# Patient Record
Sex: Female | Born: 1955 | Race: White | Hispanic: No | Smoking: Never smoker
Health system: Southern US, Community
[De-identification: ages and names within clinical notes are randomized; demographics above are authoritative.]

## PROBLEM LIST (undated history)

## (undated) DIAGNOSIS — E785 Hyperlipidemia, unspecified: Principal | ICD-10-CM

## (undated) DIAGNOSIS — G4733 Obstructive sleep apnea (adult) (pediatric): Secondary | ICD-10-CM

## (undated) DIAGNOSIS — I10 Essential (primary) hypertension: Secondary | ICD-10-CM

## (undated) DIAGNOSIS — H1859 Other hereditary corneal dystrophies: Secondary | ICD-10-CM

## (undated) DIAGNOSIS — G44321 Chronic post-traumatic headache, intractable: Secondary | ICD-10-CM

## (undated) DIAGNOSIS — F329 Major depressive disorder, single episode, unspecified: Secondary | ICD-10-CM

## (undated) HISTORY — DX: Obstructive sleep apnea (adult) (pediatric): G47.33

## (undated) HISTORY — DX: Major depressive disorder, single episode, unspecified: F32.9

## (undated) HISTORY — DX: Hyperlipidemia, unspecified: E78.5

## (undated) HISTORY — DX: Chronic post-traumatic headache, intractable: G44.321

## (undated) HISTORY — DX: Essential (primary) hypertension: I10

## (undated) HISTORY — DX: Other hereditary corneal dystrophies: H18.59

---

## 1990-08-13 HISTORY — PX: ABDOMINAL HYSTERECTOMY: SHX81

## 2010-08-13 HISTORY — PX: KNEE ARTHROSCOPY: SUR90

## 2016-05-08 ENCOUNTER — Encounter: Payer: Self-pay | Admitting: Pulmonary Disease

## 2016-05-08 ENCOUNTER — Ambulatory Visit (INDEPENDENT_AMBULATORY_CARE_PROVIDER_SITE_OTHER): Payer: BC Managed Care – PPO | Admitting: Pulmonary Disease

## 2016-05-08 VITALS — BP 134/65 | HR 80 | Temp 98.6°F | Ht 67.0 in | Wt 256.3 lb

## 2016-05-08 DIAGNOSIS — E785 Hyperlipidemia, unspecified: Secondary | ICD-10-CM

## 2016-05-08 DIAGNOSIS — Z79899 Other long term (current) drug therapy: Secondary | ICD-10-CM

## 2016-05-08 DIAGNOSIS — Z9989 Dependence on other enabling machines and devices: Secondary | ICD-10-CM

## 2016-05-08 DIAGNOSIS — K5900 Constipation, unspecified: Secondary | ICD-10-CM | POA: Diagnosis not present

## 2016-05-08 DIAGNOSIS — H1852 Epithelial (juvenile) corneal dystrophy: Secondary | ICD-10-CM | POA: Diagnosis not present

## 2016-05-08 DIAGNOSIS — Z23 Encounter for immunization: Secondary | ICD-10-CM

## 2016-05-08 DIAGNOSIS — R7989 Other specified abnormal findings of blood chemistry: Secondary | ICD-10-CM | POA: Insufficient documentation

## 2016-05-08 DIAGNOSIS — H18529 Epithelial (juvenile) corneal dystrophy, unspecified eye: Secondary | ICD-10-CM

## 2016-05-08 DIAGNOSIS — G44321 Chronic post-traumatic headache, intractable: Secondary | ICD-10-CM

## 2016-05-08 DIAGNOSIS — Z8639 Personal history of other endocrine, nutritional and metabolic disease: Secondary | ICD-10-CM

## 2016-05-08 DIAGNOSIS — K5901 Slow transit constipation: Secondary | ICD-10-CM

## 2016-05-08 DIAGNOSIS — H1859 Other hereditary corneal dystrophies: Secondary | ICD-10-CM

## 2016-05-08 DIAGNOSIS — F339 Major depressive disorder, recurrent, unspecified: Secondary | ICD-10-CM | POA: Insufficient documentation

## 2016-05-08 DIAGNOSIS — G4733 Obstructive sleep apnea (adult) (pediatric): Secondary | ICD-10-CM | POA: Insufficient documentation

## 2016-05-08 DIAGNOSIS — F32A Depression, unspecified: Secondary | ICD-10-CM

## 2016-05-08 HISTORY — DX: Hyperlipidemia, unspecified: E78.5

## 2016-05-08 HISTORY — DX: Chronic post-traumatic headache, intractable: G44.321

## 2016-05-08 HISTORY — DX: Depression, unspecified: F32.A

## 2016-05-08 HISTORY — DX: Epithelial (juvenile) corneal dystrophy, unspecified eye: H18.529

## 2016-05-08 HISTORY — DX: Obstructive sleep apnea (adult) (pediatric): G47.33

## 2016-05-08 NOTE — Patient Instructions (Addendum)
Is there anything I can do on my own to get rid of constipation? - Yes. Try these steps: ?Eat foods that have a lot of fiber. Good choices are fruits, vegetables, prune juice, and cereal. You may try adding fiber with products such as Metamucil or Benefiber. ?Drink plenty of water and other fluids. ?When you feel the need to go to the bathroom, go to the bathroom. Don't hold it. ?Take stool softeners. These are medicines that help make bowel movements easier to get out. Some are pills that you swallow. Others go into the rectum. These are called "suppositories." You may use medications such as Miralax, docusate, Dulcolax, etc.  Should I see a doctor or nurse? - See your doctor or nurse if:  ?Your symptoms are new or not normal for you ?You do not have a bowel movement for a few days ?The problem comes and goes, but lasts for longer than 3 weeks ?You are in a lot of pain ?You have other symptoms that also worry you (for example, bleeding, weakness, weight loss, or fever) ?Other people in your family have had colorectal cancer or inflammatory bowel disease  Follow up in 3 months.

## 2016-05-08 NOTE — Progress Notes (Signed)
   CC: Constipation  HPI:  Heidi Mcguire is a 60 year old woman here to establish care at [facility] and evaluate for constipation.  She has four children. She reports she has never had a hemorrhoid before. Felt like a "tumor" in her rectal area. Gone now (went away a couple of weeks ago) on visual inspection. It was very painful. At one point she got very constipated. Resolved after starting stool softener. No blood in stools. Presently, she has bowel movements every other day. Formed and soft at last bowel movement.   In a normal day, she has oatmeal with apple for breakfast. May have granola instead. Peanut butter sandwich for lunch. Drinks water throughout day. Eats fast food for dinner - chicken sandwich and french fries at [employer]. Eats cookies.    Past Medical History:  Diagnosis Date  . 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 Depression 05/08/2016  . Hyperlipidemia 05/08/2016  . Intractable chronic post-traumatic headache 05/08/2016   October 29, 2014 fell off a chair onto concrete floor  . Obstructive sleep apnea 05/08/2016   Past Surgical History:  Procedure Laterality Date  . ABDOMINAL HYSTERECTOMY  1992  . KNEE ARTHROSCOPY  2012   Allergies  Allergen Reactions  . Bupropion     Headaches  . Esomeprazole Magnesium Diarrhea  . Gemfibrozil Hives  . Lansoprazole Diarrhea  . Omeprazole Magnesium Diarrhea  . Simvastatin Hives   Review of Systems:   Constitutional: no fevers/chills Eyes: no vision changes Ears, nose, mouth, throat, and face: no cough Respiratory: no shortness of breath Cardiovascular: no chest pain Gastrointestinal: no nausea/vomiting, no abdominal pain, no diarrhea Genitourinary: no dysuria, no hematuria Integument: no rash Hematologic/lymphatic: no bleeding/bruising, no edema Musculoskeletal: no arthralgias, no myalgias Neurological: no paresthesias, no weakness  Physical Exam:  Vitals:   05/08/16 1532  BP: 134/65  Pulse: 80    Temp: 98.6 F (37 C)  TempSrc: Oral  SpO2: 98%  Weight: 116.3 kg (256 lb 4.8 oz)  Height: 5\' 7"  (1.702 m)   General Apperance: NAD HEENT: Normocephalic, atraumatic, anicteric sclera Neck: Supple, trachea midline Lungs: Clear to auscultation bilaterally. No wheezes, rhonchi or rales. Breathing comfortably Heart: Regular rate and rhythm Abdomen: Soft, nontender, nondistended, no rebound/guarding Extremities: Warm and well perfused, no edema Skin: No rashes or lesions Neurologic: Alert and interactive. No gross deficits.  Assessment & Plan:   See Encounters Tab for problem based charting.  Patient discussed with Dr. Criselda PeachesMullen

## 2016-05-09 LAB — LIPID PANEL
CHOL/HDL RATIO: 6.3 ratio — AB (ref 0.0–4.4)
CHOLESTEROL TOTAL: 225 mg/dL — AB (ref 100–199)
HDL: 36 mg/dL — AB (ref >39)
LDL CALC: 115 mg/dL — AB (ref 0–99)
TRIGLYCERIDES: 370 mg/dL — AB (ref 0–149)
VLDL Cholesterol Cal: 74 mg/dL — ABNORMAL HIGH (ref 5–40)

## 2016-05-09 LAB — BMP8+ANION GAP
ANION GAP: 17 mmol/L (ref 10.0–18.0)
BUN/Creatinine Ratio: 15 (ref 9–23)
BUN: 13 mg/dL (ref 6–24)
CALCIUM: 9.4 mg/dL (ref 8.7–10.2)
CO2: 24 mmol/L (ref 18–29)
CREATININE: 0.88 mg/dL (ref 0.57–1.00)
Chloride: 100 mmol/L (ref 96–106)
GFR calc Af Amer: 83 mL/min/{1.73_m2} (ref >59)
GFR calc non Af Amer: 72 mL/min/{1.73_m2} (ref >59)
Glucose: 94 mg/dL (ref 65–99)
Potassium: 4.3 mmol/L (ref 3.5–5.2)
SODIUM: 141 mmol/L (ref 134–144)

## 2016-05-09 LAB — TSH: TSH: 3.94 u[IU]/mL (ref 0.450–4.500)

## 2016-05-11 ENCOUNTER — Encounter: Payer: Self-pay | Admitting: Pulmonary Disease

## 2016-05-11 DIAGNOSIS — K5900 Constipation, unspecified: Secondary | ICD-10-CM | POA: Insufficient documentation

## 2016-05-11 NOTE — Assessment & Plan Note (Signed)
Assessment: Related to falling off chair onto concrete floor in March 2016. Followed by neurology previously. Would like to establish with local neurologist.  Plan: Referral placed Continue current pain regimen

## 2016-05-11 NOTE — Assessment & Plan Note (Signed)
Assessment: She would like referral to opthalmology. Previously was using Restasis but cannot afford it due to not being covered by insurance. She previously declined plugs.  Plan: Referral to opthalmology

## 2016-05-11 NOTE — Assessment & Plan Note (Addendum)
Lipid Panel     Component Value Date/Time   CHOL 225 (H) 05/08/2016 1633   TRIG 370 (H) 05/08/2016 1633   HDL 36 (L) 05/08/2016 1633   CHOLHDL 6.3 (H) 05/08/2016 1633   LDLCALC 115 (H) 05/08/2016 1633   4.9% 10-year risk of heart disease or stroke.  Plan: Continue fish oil and lifestyle modification. Letter sent to patient.

## 2016-05-11 NOTE — Assessment & Plan Note (Addendum)
Previously noted to be high in [place]. Resolved now.

## 2016-05-11 NOTE — Assessment & Plan Note (Signed)
Assessment: Uses a CPAP. She would like to establish care with sleep medicine. No complaints.  Plan: Referral placed.

## 2016-05-11 NOTE — Assessment & Plan Note (Signed)
Assessment: External hemorrhoid resolved per patient. Unlikely to be tumor given history. She denies weight loss or family history of colon cancer.  Plan: Bowel regimen recommended including maintaining hydration, increasing fiber in diet, using stool softeners as needed.

## 2016-05-21 NOTE — Progress Notes (Addendum)
Internal Medicine Clinic Attending  Case discussed with Dr. Isabella BowensKrall soon after the resident saw the patient.  We reviewed the resident's history and exam and pertinent patient test results.  I agree with the assessment, diagnosis, and plan of care documented in the resident's note.  Documented in chart, but not in below note - patient reported being a never smoker.

## 2016-06-03 ENCOUNTER — Encounter: Payer: Self-pay | Admitting: Pulmonary Disease

## 2016-06-05 NOTE — Addendum Note (Signed)
Addended by: Dorie RankPOWERS, Kaytlynn Kochan E on: 06/05/2016 10:04 AM   Modules accepted: Orders

## 2016-06-13 ENCOUNTER — Encounter: Payer: Self-pay | Admitting: Pulmonary Disease

## 2016-06-13 ENCOUNTER — Other Ambulatory Visit: Payer: Self-pay | Admitting: *Deleted

## 2016-06-14 ENCOUNTER — Other Ambulatory Visit: Payer: Self-pay | Admitting: *Deleted

## 2016-06-18 ENCOUNTER — Other Ambulatory Visit: Payer: Self-pay | Admitting: Pulmonary Disease

## 2016-06-18 MED ORDER — ESCITALOPRAM OXALATE 20 MG PO TABS: 20.0000 mg | ORAL_TABLET | Freq: Every day | ORAL | 1 refills | Status: DC

## 2016-07-01 ENCOUNTER — Encounter: Payer: Self-pay | Admitting: Pulmonary Disease

## 2016-07-13 ENCOUNTER — Telehealth: Payer: Self-pay | Admitting: Pharmacist

## 2016-07-13 DIAGNOSIS — N951 Menopausal and female climacteric states: Secondary | ICD-10-CM

## 2016-07-13 MED ORDER — ESTRADIOL 10 MCG VA TABS: 1.0000 | ORAL_TABLET | VAGINAL | 3 refills | Status: DC

## 2016-07-13 MED ORDER — ESTRADIOL 2 MG VA RING: 2.0000 mg | VAGINAL_RING | VAGINAL | 12 refills | Status: DC

## 2016-07-13 MED ORDER — ESTRADIOL 0.1 MG/GM VA CREA: 1.0000 | TOPICAL_CREAM | Freq: Every day | VAGINAL | 12 refills | Status: DC

## 2016-07-13 NOTE — Progress Notes (Signed)
Patient called requesting help with cost of estradiol vaginal tablet. Tried various vaginal formulations and all are more than $150 copay. Advised patient to follow up with PCP and contact clinic if any concerns arise. Will continue to collaborate in the care of this patient.

## 2016-07-19 ENCOUNTER — Encounter: Payer: Self-pay | Admitting: Pulmonary Disease

## 2016-07-19 ENCOUNTER — Ambulatory Visit (INDEPENDENT_AMBULATORY_CARE_PROVIDER_SITE_OTHER): Payer: BC Managed Care – PPO | Admitting: Pulmonary Disease

## 2016-07-19 VITALS — BP 148/82 | HR 57 | Ht 67.0 in | Wt 254.8 lb

## 2016-07-19 DIAGNOSIS — G4733 Obstructive sleep apnea (adult) (pediatric): Secondary | ICD-10-CM

## 2016-07-19 NOTE — Progress Notes (Signed)
Past Surgical History She  has a past surgical history that includes Abdominal hysterectomy (1992) and Knee arthroscopy (2012).  Allergies  Allergen Reactions  . Bupropion     Headaches  . Esomeprazole Magnesium Diarrhea  . Gemfibrozil Hives  . Lansoprazole Diarrhea    diarrhea  . Omeprazole Magnesium Diarrhea  . Simvastatin Hives    hives    Family History Her family history includes Alzheimer's disease in her father; Bipolar disorder in her sister; Depression in her mother and sister.  Social History She  reports that she has never smoked. She has never used smokeless tobacco. She reports that she drinks alcohol. She reports that she does not use drugs.  Review of systems Constitutional: Negative for fever and unexpected weight change.  HENT: Negative for congestion, dental problem, ear pain, nosebleeds, postnasal drip, rhinorrhea, sinus pressure, sneezing, sore throat and trouble swallowing.   Eyes: Negative for redness and itching.  Respiratory: Negative for cough, chest tightness, shortness of breath and wheezing.   Cardiovascular: Negative for palpitations and leg swelling.  Gastrointestinal: Negative for nausea and vomiting.  Genitourinary: Negative for dysuria.  Musculoskeletal: Negative for joint swelling.  Skin: Negative for rash.  Neurological: Positive for headaches.  Hematological: Does not bruise/bleed easily.  Psychiatric/Behavioral: Negative for dysphoric mood. The patient is not nervous/anxious.     Current Outpatient Prescriptions on File Prior to Visit  Medication Sig  . Calcium Carbonate-Vitamin D (CALCIUM 500 + D) 500-125 MG-UNIT TABS Take 2 tablets by mouth daily.  Marland Kitchen. escitalopram (LEXAPRO) 20 MG tablet Take 1 tablet (20 mg total) by mouth daily.  Marland Kitchen. ibuprofen (ADVIL,MOTRIN) 800 MG tablet Take 1 tablet by mouth every 8 (eight) hours as needed.  . Multiple Vitamin (MULTIVITAMIN) capsule Take 1 capsule by mouth daily.  . Nutritional Supplements (ADULT  NUTRITIONAL SUPPLEMENT PO) Take 1 tablet by mouth 2 (two) times daily.  . ranitidine (ZANTAC) 150 MG tablet Take 1 tablet by mouth 2 (two) times daily.  . traMADol (ULTRAM) 50 MG tablet Take 1 tablet by mouth every 8 (eight) hours as needed.   No current facility-administered medications on file prior to visit.     Chief Complaint  Patient presents with  . SLEEP CONSULT    Referred by Dr Janeece RiggersKraull. Pt currently uses CPAP machine, pressure 8. Needs new DME. Epworth Score: 2    Sleep tests PSG 09/01/13 >> AI 54 CPAP 04/20/16 to 07/18/16 >> used on 90 of 90 nights with average 9 hrs 19 min.  Average AHI 1.5 with CPAP 8 cm H2O  Past medical history She  has a past medical history of Corneal epithelial and basement membrane dystrophy (05/08/2016); Depression (05/08/2016); Hyperlipidemia (05/08/2016); Intractable chronic post-traumatic headache (05/08/2016); and Obstructive sleep apnea (05/08/2016).  Vital signs BP (!) 148/82 (BP Location: Left Arm, Cuff Size: Normal)   Pulse (!) 57   Ht 5\' 7"  (1.702 m)   Wt 254 lb 12.8 oz (115.6 kg)   SpO2 97%   BMI 39.91 kg/m   History of Present Illness Heidi Mcguire is a 60 y.o. female for evaluation of sleep problems.  She had [facility] January [facility] while living in [place].  She was found to have severe sleep apnea, and started on CPAP 8 cm H2O.  She recently retired, and [place].  She continues to use CPAP, and needs to establish with a sleep medicine provider [place] .  She goes to sleep between 8 and  10 pm.  She falls asleep after 30 minutes.  She wakes up one time to use the bathroom.  She gets out of bed at 630 am.  She feels rested in the morning.  She does not use anything to help her fall sleep or stay awake.  She fell at work in 2006 and hit her head.  She suffered a concussion, and since then gets migraine headaches.  She has noticed getting funny feelings in her feet while she is  trying to fall asleep.  She wiggles her feet for several minutes, and then she is able to fall asleep.  She uses nasal pillows mask.  No issues with mask fit.  She tried full face mask before, but finds her current mask more comfortable.  She denies sleep walking, sleep talking, bruxism, or nightmares.  There is no history of restless legs.  She denies sleep hallucinations, sleep paralysis, or cataplexy.  The Epworth score is 2 out of 24.   Physical Exam:  General - No distress ENT - No sinus tenderness, no oral exudate, no LAN, no thyromegaly, TM clear, pupils equal/reactive, MP 3, enlarged tongue Cardiac - s1s2 regular, no murmur, pulses symmetric Chest - No wheeze/rales/dullness, good air entry, normal respiratory excursion Back - No focal tenderness Abd - Soft, non-tender, no organomegaly, + bowel sounds Ext - No edema Neuro - Normal strength, cranial nerves intact Skin - No rashes Psych - Normal mood, and behavior  Discussion: She has history of severe sleep apnea and has been maintained on CPAP 8 cm H2O.  She is compliant with therapy, and needs to establish with a sleep medicine provider in [place].  She also has symptoms suggestive of restless leg syndrome.  She feels her current symptoms are mild.  Assessment/plan:  Obstructive sleep apnea. - continue CPAP 8 cm H2O - will arrange for new DME for CPAP supplies - will get [facility]  Restless leg syndrome. - mild - monitor clinically - if progresses, then consider lab analysis including ferritin level   Patient Instructions  Will have you sign release form to get copy of sleep study [facility]  Will arrange for new home care company for CPAP supplies  Follow up in 1 year    Coralyn HellingVineet Cloud Graham, M.D. Pager [phone] 07/19/2016, 10:09 AM

## 2016-07-19 NOTE — Progress Notes (Signed)
   Subjective:    Patient ID: Heidi Mcguire, female    DOB: 1955-10-10, 60 y.o.   MRN: [id number]  HPI    Review of Systems  Constitutional: Negative for fever and unexpected weight change.  HENT: Negative for congestion, dental problem, ear pain, nosebleeds, postnasal drip, rhinorrhea, sinus pressure, sneezing, sore throat and trouble swallowing.   Eyes: Negative for redness and itching.  Respiratory: Negative for cough, chest tightness, shortness of breath and wheezing.   Cardiovascular: Negative for palpitations and leg swelling.  Gastrointestinal: Negative for nausea and vomiting.  Genitourinary: Negative for dysuria.  Musculoskeletal: Negative for joint swelling.  Skin: Negative for rash.  Neurological: Positive for headaches.  Hematological: Does not bruise/bleed easily.  Psychiatric/Behavioral: Negative for dysphoric mood. The patient is not nervous/anxious.        Objective:   Physical Exam        Assessment & Plan:

## 2016-07-19 NOTE — Patient Instructions (Signed)
Will have you sign release form to get copy of sleep study from [facility]  Will arrange for new home care company for CPAP supplies  Follow up in 1 year

## 2016-08-01 ENCOUNTER — Telehealth: Payer: Self-pay | Admitting: Pulmonary Disease

## 2016-08-01 NOTE — Telephone Encounter (Signed)
APT. REMINDER CALL, LMTCB °

## 2016-08-02 ENCOUNTER — Ambulatory Visit (INDEPENDENT_AMBULATORY_CARE_PROVIDER_SITE_OTHER): Payer: BC Managed Care – PPO | Admitting: Pulmonary Disease

## 2016-08-02 ENCOUNTER — Encounter: Payer: Self-pay | Admitting: Pulmonary Disease

## 2016-08-02 VITALS — BP 139/68 | HR 84 | Temp 98.7°F | Ht 67.0 in | Wt 258.1 lb

## 2016-08-02 DIAGNOSIS — G44321 Chronic post-traumatic headache, intractable: Secondary | ICD-10-CM | POA: Diagnosis not present

## 2016-08-02 DIAGNOSIS — N952 Postmenopausal atrophic vaginitis: Secondary | ICD-10-CM

## 2016-08-02 DIAGNOSIS — Z6841 Body Mass Index (BMI) 40.0 and over, adult: Secondary | ICD-10-CM | POA: Diagnosis not present

## 2016-08-02 MED ORDER — SUMATRIPTAN SUCCINATE 50 MG PO TABS: 50.0000 mg | ORAL_TABLET | ORAL | 0 refills | Status: DC | PRN

## 2016-08-02 NOTE — Progress Notes (Signed)
   CC: Headache  HPI:  [name] is a 60 y.o. woman with HLD, chronic post traumatic headache, OSA presenting for follow up of her headache  Her headache has gotten a lot worse. She has sensitive to light, sound. She has nausea. She takes tramadol, ibuprofen, Migravent. She just has chronic vision changes and no acute. Headaches are more in the morning but she does not wake up with headaches.  She has had these headaches since 2016 after falling at work with head injury.  She has tried Gabapentin and Topamax. Her appointment is January 22nd with neurology.   Past Medical History:  Diagnosis Date  . 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 Depression 05/08/2016  . Hyperlipidemia 05/08/2016  . Intractable chronic post-traumatic headache 05/08/2016   October 29, 2014 fell off a chair onto concrete floor  . Obstructive sleep apnea 05/08/2016    Review of Systems:   No chest pain No dyspnea  Physical Exam:  Vitals:   08/02/16 1603  BP: 139/68  Pulse: 84  Temp: 98.7 F (37.1 C)  TempSrc: Oral  SpO2: 98%  Weight: 258 lb 1.6 oz (117.1 kg)  Height: 5\' 7"  (1.702 m)   General Apperance: NAD HEENT: Normocephalic, atraumatic, anicteric sclera Neck: Supple, trachea midline Lungs: Clear to auscultation bilaterally. No wheezes, rhonchi or rales. Breathing comfortably Heart: Regular rate and rhythm, no murmur/rub/gallop Abdomen: Soft, nontender, nondistended, no rebound/guarding Extremities: Warm and well perfused, no edema Skin: No rashes or lesions Neurologic: Alert and interactive. No gross deficits.  Assessment & Plan:   See Encounters Tab for problem based charting.  Patient discussed with Dr. Criselda PeachesMullen

## 2016-08-02 NOTE — Patient Instructions (Signed)
You may take sumatriptan (Imitrex) single dose of 50 mg (taken with fluids) for your ehadache. If a satisfactory response has not been obtained at 2 hours, a second dose may be administered. The total daily dose should not exceed 200 mg.  Follow up in 2-3 months

## 2016-08-03 DIAGNOSIS — N952 Postmenopausal atrophic vaginitis: Secondary | ICD-10-CM | POA: Insufficient documentation

## 2016-08-03 NOTE — Assessment & Plan Note (Signed)
Assessment: Post traumatic headache that is chronic. She does not think she has tried a triptan in the past.   Plan: Sumatriptan 50mg  every 2 hours as needed. If she does not find relief with this may consider trying antiemetic. Encouraged her to keep her appointment with neurology

## 2016-08-03 NOTE — Assessment & Plan Note (Signed)
She was previously followed by gynecology in [place]. She is going to try vaginal moisterizers OTC. She would like to establish care with a gynecologist here. Referral placed

## 2016-08-03 NOTE — Assessment & Plan Note (Signed)
She is interested in dietary changes to lose weight. Referred her to medical nutrition therapy.

## 2016-08-08 ENCOUNTER — Telehealth: Payer: Self-pay | Admitting: *Deleted

## 2016-08-08 NOTE — Progress Notes (Signed)
Internal Medicine Clinic Attending  Case discussed with Dr. Krall soon after the resident saw the patient.  We reviewed the resident's history and exam and pertinent patient test results.  I agree with the assessment, diagnosis, and plan of care documented in the resident's note. 

## 2016-08-16 ENCOUNTER — Ambulatory Visit: Payer: BC Managed Care – PPO | Admitting: Dietician

## 2016-08-22 ENCOUNTER — Telehealth: Payer: Self-pay | Admitting: Pulmonary Disease

## 2016-08-22 NOTE — Telephone Encounter (Signed)
APT. REMINDER CALL, LMTCB °

## 2016-08-23 ENCOUNTER — Ambulatory Visit (INDEPENDENT_AMBULATORY_CARE_PROVIDER_SITE_OTHER): Payer: BC Managed Care – PPO | Admitting: Dietician

## 2016-08-23 DIAGNOSIS — Z713 Dietary counseling and surveillance: Secondary | ICD-10-CM | POA: Diagnosis not present

## 2016-08-23 DIAGNOSIS — Z6841 Body Mass Index (BMI) 40.0 and over, adult: Secondary | ICD-10-CM | POA: Diagnosis not present

## 2016-08-23 NOTE — Progress Notes (Signed)
  Medical Nutrition Therapy:  Appt start time: 1500 end time:  1545. Visit # 1  Assessment:  Primary concerns today: assistance with meal planning and problem solving.  [name] wanted help today with figuring out a plan to help her eat healthier and decrease her weight.she reports having kept food records in the past.  She is currently stressed due to being [place] full [place], about to move, has daily migraines with some nausea. She is thinking about starting to grow her own fruits and vegetables after she moves to her new home.  She says she comes home after work like a "zombie" and eats large amounts of food that are often fast food or convenience foods. We discussed breaking behavior chains, preparing foods ahead of time on weekends and recipes that she likes and are healthy.  Preferred Learning Style: No preference indicated  Learning Readiness: Contemplating/Ready  ANTHROPOMETRICS: weight-258#, height-67", BMI-40.5 WEIGHT HISTORY: need to assess at future visit SLEEP:uses a CPAP, some times has trouble falling asleep, sleeps less than her desired time during the week and 10-12 hours on weekends Food Intolerances: none reported Nausea: sometimes with migraines Dining Out (times/week): has been eating fast food more lately because of work and moving stress MEDICATIONS: ranitidine, calcium with Vitamin D Labs:she says she has prediabetes, but we do not have an A1C, note LDL of 115 HDL of 36 and triglycerides of 370  DIETARY INTAKE: Usual eating pattern not discussed today except that she eats at least 3 times a day with her migraine medicine, and during the work week comes home and eats large amounts, much fast food and snack foods like chips. 24 hours recall not done today    Usual physical activity: limited to ADLs- works 5 days a week and is gong to move this weekend  Estimated daily energy needs: 2100-2200 calories to maintain, 1600-1800 for  gradual loss 50- 100 g protein depends on weight goals, 50 for maintenance, 75-100 for weight loss   Progress Towards Goal(s):  In progress.   Nutritional Diagnosis:  NB-1.1 Food and nutrition-related knowledge deficit As related to lack of sufficient prior meal plan training and education.  As evidenced by her report and questions.    Intervention:  Nutrition education about the process of breaking habits, making behavior changes and how to achieve weight loss. Patient prefers to work on changes by herself and to call as needed.  Coordination of care: none needed today although it is appropriate to offer weight loss medication or bariatric surgery with BMI of 40 and co morbidities of hyperlipidemia,  OSA, ,Depression   Teaching Method Utilized: Visual.Auditory,Hands on Handouts given during visit include:soup and stew recipes, weight loss information and lifestyle  Barriers to learning/adherence to lifestyle change: competing values, depression, questions her support system Demonstrated degree of understanding via:  Teach Back   Monitoring/Evaluation:  Dietary intake, exercise, and body weight prn per patient preference. Norrin Shreffler, Lupita LeashDonna, RD 08/23/2016 4:20 PM.

## 2016-09-05 ENCOUNTER — Ambulatory Visit (INDEPENDENT_AMBULATORY_CARE_PROVIDER_SITE_OTHER): Payer: BC Managed Care – PPO | Admitting: Internal Medicine

## 2016-09-05 ENCOUNTER — Ambulatory Visit (HOSPITAL_COMMUNITY)
Admission: RE | Admit: 2016-09-05 | Discharge: 2016-09-05 | Disposition: A | Discharge status: Home / Self Care | Payer: BC Managed Care – PPO | Source: Ambulatory Visit | Attending: Internal Medicine | Admitting: Internal Medicine

## 2016-09-05 DIAGNOSIS — M5010 Cervical disc disorder with radiculopathy, unspecified cervical region: Secondary | ICD-10-CM | POA: Diagnosis present

## 2016-09-05 DIAGNOSIS — Z818 Family history of other mental and behavioral disorders: Secondary | ICD-10-CM

## 2016-09-05 DIAGNOSIS — M4802 Spinal stenosis, cervical region: Secondary | ICD-10-CM | POA: Insufficient documentation

## 2016-09-05 DIAGNOSIS — M546 Pain in thoracic spine: Secondary | ICD-10-CM

## 2016-09-05 DIAGNOSIS — G43909 Migraine, unspecified, not intractable, without status migrainosus: Secondary | ICD-10-CM | POA: Diagnosis not present

## 2016-09-05 DIAGNOSIS — M79602 Pain in left arm: Secondary | ICD-10-CM

## 2016-09-05 DIAGNOSIS — Z820 Family history of epilepsy and other diseases of the nervous system: Secondary | ICD-10-CM

## 2016-09-05 DIAGNOSIS — M542 Cervicalgia: Secondary | ICD-10-CM | POA: Diagnosis not present

## 2016-09-05 DIAGNOSIS — Z79899 Other long term (current) drug therapy: Secondary | ICD-10-CM

## 2016-09-05 MED ORDER — GABAPENTIN 300 MG PO CAPS: 300.0000 mg | ORAL_CAPSULE | Freq: Three times a day (TID) | ORAL | 2 refills | Status: DC

## 2016-09-05 NOTE — Progress Notes (Signed)
CC: shoulder pain HPI: [name] is a 61 y.o. female with a h/o of migraines who presents for shoulder pain.  Please see Problem-based charting for HPI and the status of patient's chronic medical conditions.  Past Medical History:  Diagnosis Date  . 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 Depression 05/08/2016  . Hyperlipidemia 05/08/2016  . Intractable chronic post-traumatic headache 05/08/2016   October 29, 2014 fell off a chair onto concrete floor  . Obstructive sleep apnea 05/08/2016   Social History  Substance Use Topics  . Smoking status: Never Smoker  . Smokeless tobacco: Never Used  . Alcohol use Yes     Comment: Rarely.   Family History  Problem Relation Age of Onset  . Depression Mother   . Alzheimer's disease Father   . Depression Sister   . Bipolar disorder Sister    Review of Systems: ROS in HPI. Otherwise: Review of Systems  Constitutional: Negative for chills, fever and weight loss.  Respiratory: Negative for cough and shortness of breath.   Cardiovascular: Negative for chest pain and leg swelling.  Gastrointestinal: Negative for abdominal pain, constipation, diarrhea, nausea and vomiting.  Genitourinary: Negative for dysuria, frequency and urgency.  Musculoskeletal: Positive for back pain and neck pain. Negative for falls, joint pain and myalgias.  Neurological: Positive for tingling, sensory change and headaches. Negative for dizziness and focal weakness.   Physical Exam: Vitals:   09/05/16 1613  BP: (!) 148/76  Pulse: 79  Temp: 99.8 F (37.7 C)  TempSrc: Oral  SpO2: 100%  Weight: 259 lb 8 oz (117.7 kg)   Physical Exam  Constitutional: She is oriented to person, place, and time. She appears well-developed. She is cooperative. No distress.  Cardiovascular: Normal rate, regular rhythm, normal heart sounds and normal pulses.  Exam reveals no gallop.   No murmur heard. Pulmonary/Chest: Effort normal and breath sounds  normal. No respiratory distress. She has no wheezes. She has no rhonchi. She has no rales. Breasts are symmetrical.  Abdominal: Soft. Bowel sounds are normal. There is no tenderness.  Musculoskeletal: She exhibits no edema.  TTP over C8/T1 spine, no muscular TTP. ROM intact to aBduction, empty can sign negative. Strength intact fully.  Neurological: She is alert and oriented to person, place, and time. No cranial nerve deficit or sensory deficit. She exhibits normal muscle tone.    Assessment & Plan:  See encounters tab for problem based medical decision making. Patient discussed with Dr. Rogelia Boga  Cervical disc disorder with radiculopathy of cervical region Patient presented with a 5 day history of sharp burning pain which starts in the middle of the shoulder blades and radiates down the left arm. The ports of the pain is worse with movement of her neck in certain directions as well as with some particular movements of her left arm. She has no pain elicited with the empty can test, and is able to fully lift her hand off the small of her back. She has no tenderness or crepitus around the shoulder joint. She does have some mild tenderness to palpation over the thoracic spine around C8 and T1. Cranial nerve exam is intact, she has full strength in the upper extremity, she complains of some intermittent paresthesias but has no paresthesias on exam today.  Her symptoms are most consistent this consistent with a cervical radiculopathy. She's been getting some relief with tramadol which she has at home for migraines. She has been using ibuprofen which providing some relief. She  had a similar episode of this 15 years ago and was successfully treated with physical therapy.  Plan: Referral for physical therapy. Start gabapentin 300 mg 3 times a day, follow-up by phone and titrate as needed. Continue tramadol as needed for breakthrough pain. Continue ibuprofen scheduled for pain every 8  hours.   Signed: Carolynn CommentBryan Kathaleen Dudziak, MD 09/05/2016, 5:02 PM  Pager: [phone]

## 2016-09-05 NOTE — Assessment & Plan Note (Signed)
Patient presented with a 5 day history of sharp burning pain which starts in the middle of the shoulder blades and radiates down the left arm. The ports of the pain is worse with movement of her neck in certain directions as well as with some particular movements of her left arm. She has no pain elicited with the empty can test, and is able to fully lift her hand off the small of her back. She has no tenderness or crepitus around the shoulder joint. She does have some mild tenderness to palpation over the thoracic spine around C8 and T1. Cranial nerve exam is intact, she has full strength in the upper extremity, she complains of some intermittent paresthesias but has no paresthesias on exam today.  Her symptoms are most consistent this consistent with a cervical radiculopathy. She's been getting some relief with tramadol which she has at home for migraines. She has been using ibuprofen which providing some relief. She had a similar episode of this 15 years ago and was successfully treated with physical therapy.  Plan: Referral for physical therapy. Start gabapentin 300 mg 3 times a day, follow-up by phone and titrate as needed. Continue tramadol as needed for breakthrough pain. Continue ibuprofen scheduled for pain every 8 hours.

## 2016-09-05 NOTE — Patient Instructions (Signed)
The symptoms are consistent with a nerve impingement in your neck. I would like to try medication called gabapentin. You should start this medication taking 1 pill once a day and then increasing to twice a day and then 3 times a day as long as you are not expressing excessive fatigue or sedation. In addition you can continue to use her tramadol and ibuprofen as needed for breakthrough pain. I would like to evaluate with an x-ray and we'll call you with the results of the study in several days.  The long-term solution for this problem will likely be physical therapy. I will place a referral for that today and symmetric call you in order to help schedule.

## 2016-09-08 NOTE — Progress Notes (Signed)
Internal Medicine Clinic Attending  Case discussed with Dr. Strelow at the time of the visit.  We reviewed the resident's history and exam and pertinent patient test results.  I agree with the assessment, diagnosis, and plan of care documented in the resident's note.  

## 2016-09-18 ENCOUNTER — Ambulatory Visit: Payer: BC Managed Care – PPO | Attending: Internal Medicine

## 2016-09-18 DIAGNOSIS — M5412 Radiculopathy, cervical region: Secondary | ICD-10-CM | POA: Diagnosis present

## 2016-09-18 DIAGNOSIS — R293 Abnormal posture: Secondary | ICD-10-CM | POA: Diagnosis present

## 2016-09-18 DIAGNOSIS — M542 Cervicalgia: Secondary | ICD-10-CM | POA: Diagnosis present

## 2016-09-18 NOTE — Therapy (Signed)
[facility] [address] [place], [place], [address] Phone: [phone]   Fax:  [phone]  Physical Therapy Evaluation  Patient Details  Name: Heidi Mcguire MRN: [id number] Date of Birth: 12/24/1955 Referring Provider: Carolynn Comment , MD  Encounter Date: 09/18/2016      PT End of Session - 09/18/16 1612    Visit Number 1   Number of Visits 12   Date for PT Re-Evaluation 10/26/16   Authorization Type BCBS   PT Start Time 0345   PT Stop Time 0435   PT Time Calculation (min) 50 min   Activity Tolerance Patient tolerated treatment well;No increased pain   Behavior During Therapy WFL for tasks assessed/performed      Past Medical History:  Diagnosis Date  . 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 Depression 05/08/2016  . Hyperlipidemia 05/08/2016  . Intractable chronic post-traumatic headache 05/08/2016   October 29, 2014 fell off a chair onto concrete floor  . Obstructive sleep apnea 05/08/2016    Past Surgical History:  Procedure Laterality Date  . ABDOMINAL HYSTERECTOMY  1992  . KNEE ARTHROSCOPY  2012    There were no vitals filed for this visit.       Subjective Assessment - 09/18/16 1544    Subjective She reports neck pain and radiculopathy with symptoms in LT arm after picking up crate of bottled water.  RT arm involved now due to over use.   Doing exercises from before but  not helping.    Reaching forward incr pain most   Limitations --  Not using Lt arm for work and home tasks . pain with computer work,   driving,   sleeping on back as side lying impossible   How long can you sit comfortably? As needed  with good posture   How long can you stand comfortably? As needed   How long can you walk comfortably? As needed   Diagnostic tests xray:             [facility] PT Assessment - 09/18/16 0001      Assessment   Medical Diagnosis Cervical radiculopathy LT   Referring Provider Carolynn Comment , MD   Onset Date/Surgical Date --  3 weeks   Hand Dominance Right   Next MD Visit As needed   Prior Therapy  She received [place], massage, ice,  exercises     Precautions   Precautions None     Restrictions   Weight Bearing Restrictions No     Balance Screen   Has the patient fallen in the past 6 months No   Has the patient had a decrease in activity level because of a fear of falling?  No   Is the patient reluctant to leave their home because of a fear of falling?  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     Cognition   Overall Cognitive Status Within Functional Limits for tasks assessed     Observation/Other Assessments   Focus on Therapeutic Outcomes (FOTO)  63% limited     Posture/Postural Control   Posture Comments forward head and postures with flexion on neck to ease symptoms     ROM / Strength   AROM / PROM / Strength AROM;Strength     AROM   AROM Assessment Site Cervical  Cervical Flexion 50   Cervical Extension 48  neck pain   Cervical - Right Side Bend 35   Cervical - Left Side Bend 22   Cervical - Right Rotation 60   Cervical - Left Rotation 52     Strength   Overall Strength Comments Nomal UE bilateral     Palpation   Palpation comment She felt better in neck  but felt fingers tingle with manual traction     Ambulation/Gait   Gait Comments WFl                   [facility] Adult PT Treatment/Exercise - 09/18/16 0001      Modalities   Modalities Electrical Stimulation;Moist Heat;Ultrasound;Traction     Traction   Type of Traction Cervical   Min (lbs) 5   Max (lbs) 14   Hold Time 60   Rest Time 20   Time 5  stopped due to triceps pain                  PT Short Term Goals - 09/18/16 1616      PT SHORT TERM GOAL #1   Title She will be independent with inital HEP   Time 3   Period Weeks    Status New     PT SHORT TERM GOAL #2   Title She will report pain improved 30% or more with use of LT arm for reaching   Time 3   Period Weeks   Status New     PT SHORT TERM GOAL #3   Title She will demo understanding of goo cervical posture   Time 3   Period Weeks   Status New           PT Long Term Goals - 09/18/16 1617      PT LONG TERM GOAL #1   Title She will be independent with all HEP issued   Time 6   Period Weeks     PT LONG TERM GOAL #2   Title She will repor pain as intermittant and decreased 75% or more   Time 6   Period Weeks   Status New     PT LONG TERM GOAL #3   Title she will have 1-2/10 max pain with work tasks    Time 6   Period Weeks   Status New     PT LONG TERM GOAL #4   Title She will have 1-2 max pain with reaching and lifting into cabinets  and at work   Time 6   Period Weeks   Status New     PT LONG TERM GOAL #5   Title she will be able to lye flat for sleep with 1-2/10 max pain without Lt arm supported.    Time 6   Period Weeks   Status New     Additional Long Term Goals   Additional Long Term Goals Yes     PT LONG TERM GOAL #6   Title she will be able to use computer without LT ar pain   Time 6   Period Weeks   Status New               Plan - 09/18/16 1613    Clinical Impression Statement Ms Hyman HopesWebb presents for low complexity eval with complaints of pain and symptoms in to Lt arm with parasthesias into LT hand. She is limited with use of LT arm for home and work tasks .  He strength is normal. ROM is normal in UE and stiffness of cervvical spinel and forward head posture. Afte  5 min intraction she reported increased triceps pain LT so stopped . Traction felt good up to that time   Rehab Potential Good   PT Frequency 2x / week   PT Duration 6 weeks   PT Treatment/Interventions Cryotherapy;Electrical Stimulation;Iontophoresis 4mg /ml Dexamethasone;Moist Heat;Ultrasound;Patient/family education;Passive range of  motion;Manual techniques;Therapeutic exercise;Dry needling   PT Next Visit Plan REassess traction and advance if appropriate , add stabilization exercises, modalites as needed   Consulted and Agree with Plan of Care Patient      Patient will benefit from skilled therapeutic intervention in order to improve the following deficits and impairments:  Decreased range of motion, Pain, Impaired UE functional use, Increased muscle spasms, Postural dysfunction, Decreased activity tolerance  Visit Diagnosis: Radiculopathy, cervical region - Plan: PT plan of care cert/re-cert  Cervicalgia - Plan: PT plan of care cert/re-cert  Abnormal posture - Plan: PT plan of care cert/re-cert     Problem List Patient Active Problem List   Diagnosis Date Noted  . Cervical disc disorder with radiculopathy of cervical region 09/05/2016  . Vaginal atrophy 08/03/2016  . Severe obesity (BMI >= 40) (HCC) 08/03/2016  . Constipation 05/11/2016  . Hyperlipidemia 05/08/2016  . 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 Intractable chronic post-traumatic headache 05/08/2016  . Depression 05/08/2016  . Obstructive sleep apnea 05/08/2016  . Abnormal TSH 05/08/2016    Caprice Red  PT 09/18/2016, 4:31 PM  [facility] [address] [place], [place], [address] Phone: [phone]   Fax:  [phone]  Name: Heidi Mcguire MRN: [id number] Date of Birth: 1955-09-27

## 2016-09-25 ENCOUNTER — Ambulatory Visit: Payer: BC Managed Care – PPO | Admitting: Physical Therapy

## 2016-09-25 DIAGNOSIS — M542 Cervicalgia: Secondary | ICD-10-CM

## 2016-09-25 DIAGNOSIS — M5412 Radiculopathy, cervical region: Secondary | ICD-10-CM

## 2016-09-25 DIAGNOSIS — R293 Abnormal posture: Secondary | ICD-10-CM

## 2016-09-25 NOTE — Patient Instructions (Signed)
Levator Stretch   Grasp seat or sit on hand on side to be stretched(OPTIONAL) Turn head toward other side and look down. Use hand on head to gently stretch neck in that position (OPTIONAL). Hold __10__ seconds. Repeat on other side. Repeat __3__ times. Do _2___ sessions per day.   Side-Bending   One hand on opposite side of head (OPTIONAL) , pull head to side as far as is comfortable. Stop if there is pain. Hold _10___ seconds. Repeat with other hand to other side. Repeat _3___ times each side.. Do __2__ sessions per day.   Copyright  VHI. All rights reserved.  Scapular Retraction (Standing)   With arms at sides, pinch shoulder blades together. Hold 5 sec Repeat __10__ times per set. Do ____ sets per session. Do __2__ sessions per day.  http://orth.exer.us/944   Copyright  VHI. All rights reserved.  Chin Protraction / Retraction   Stop if you have increased arm pain for, numbness, or tingling  Slide head forward keeping chin level. Slide head back, pulling chin in. Hold each position __5_ seconds. Repeat 10___ times. Do _2__ sessions per day.

## 2016-09-25 NOTE — Therapy (Signed)
[facility] [address] [place], [place], [address] Phone: [phone]   Fax:  [phone]  Physical Therapy Treatment  Patient Details  Name: Heidi Mcguire MRN: [id number] Date of Birth: 04-Jul-1956 Referring Provider: Carolynn Comment , MD  Encounter Date: 09/25/2016      PT End of Session - 09/25/16 1551    Visit Number 2   Number of Visits 12   Date for PT Re-Evaluation 10/26/16   Authorization Type BCBS   PT Start Time 0345   PT Stop Time 0418   PT Time Calculation (min) 33 min      Past Medical History:  Diagnosis Date  . 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 Depression 05/08/2016  . Hyperlipidemia 05/08/2016  . Intractable chronic post-traumatic headache 05/08/2016   October 29, 2014 fell off a chair onto concrete floor  . Obstructive sleep apnea 05/08/2016    Past Surgical History:  Procedure Laterality Date  . ABDOMINAL HYSTERECTOMY  1992  . KNEE ARTHROSCOPY  2012    There were no vitals filed for this visit.      Subjective Assessment - 09/25/16 1548    Subjective I am much better than last time.    Currently in Pain? Yes   Pain Score 3    Pain Location Arm   Pain Orientation Left;Mid   Pain Descriptors / Indicators Sharp   Pain Frequency Intermittent   Aggravating Factors  positioning/ slouching   Pain Relieving Factors good posture                          [facility] Adult PT Treatment/Exercise - 09/25/16 0001      Self-Care   Self-Care Posture   Posture Discussed posture as it relates to cervical radiculopathy     Neck Exercises: Seated   Neck Retraction 5 reps;5 secs   Neck Retraction Limitations N/T in left hand with repeated reps    Postural Training scap retraction x 10      Traction   Min (lbs) 6   Max (lbs) 12   Hold Time 60   Rest Time 10   Time 6  left arm became painful so disc,      Neck Exercises: Stretches   Upper Trapezius Stretch 10  seconds;2 reps   Levator Stretch 2 reps;10 seconds                PT Education - 09/25/16 1609    Education provided Yes   Education Details HEP    Person(s) Educated Patient   Methods Explanation;Handout   Comprehension Verbalized understanding          PT Short Term Goals - 09/18/16 1616      PT SHORT TERM GOAL #1   Title She will be independent with inital HEP   Time 3   Period Weeks   Status New     PT SHORT TERM GOAL #2   Title She will report pain improved 30% or more with use of LT arm for reaching   Time 3   Period Weeks   Status New     PT SHORT TERM GOAL #3   Title She will demo understanding of goo cervical posture   Time 3   Period Weeks   Status New           PT Long Term Goals - 09/18/16 1617      PT LONG TERM GOAL #1  Title She will be independent with all HEP issued   Time 6   Period Weeks     PT LONG TERM GOAL #2   Title She will repor pain as intermittant and decreased 75% or more   Time 6   Period Weeks   Status New     PT LONG TERM GOAL #3   Title she will have 1-2/10 max pain with work tasks    Time 6   Period Weeks   Status New     PT LONG TERM GOAL #4   Title She will have 1-2 max pain with reaching and lifting into cabinets  and at work   Time 6   Period Weeks   Status New     PT LONG TERM GOAL #5   Title she will be able to lye flat for sleep with 1-2/10 max pain without Lt arm supported.    Time 6   Period Weeks   Status New     Additional Long Term Goals   Additional Long Term Goals Yes     PT LONG TERM GOAL #6   Title she will be able to use computer without LT ar pain   Time 6   Period Weeks   Status New               Plan - 09/25/16 1609    Clinical Impression Statement Pt reports significant improvement in left arm symptoms following traction last visit. Added seated neck tension exercises today and updated HEP. Repeated traction with left arm supported with pillow. Pt only able to  tolerate 6 minutes of traction however reports no pain after treatment.    PT Next Visit Plan REassess traction and advance if appropriate  , review and progress stabilization exercises as tolerated, modalites as needed   Consulted and Agree with Plan of Care Patient      Patient will benefit from skilled therapeutic intervention in order to improve the following deficits and impairments:  Decreased range of motion, Pain, Impaired UE functional use, Increased muscle spasms, Postural dysfunction, Decreased activity tolerance  Visit Diagnosis: Radiculopathy, cervical region  Cervicalgia  Abnormal posture     Problem List Patient Active Problem List   Diagnosis Date Noted  . Cervical disc disorder with radiculopathy of cervical region 09/05/2016  . Vaginal atrophy 08/03/2016  . Severe obesity (BMI >= 40) (HCC) 08/03/2016  . Constipation 05/11/2016  . Hyperlipidemia 05/08/2016  . 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 Intractable chronic post-traumatic headache 05/08/2016  . Depression 05/08/2016  . Obstructive sleep apnea 05/08/2016  . Abnormal TSH 05/08/2016    Sherrie Mustacheonoho, Jenae Tomasello McGee, PTA 09/25/2016, 4:24 PM  [facility] [address] [place], [place], [address] Phone: [phone]   Fax:  [phone]  Name: Heidi Mcguire MRN: [id number] Date of Birth: 31-Mar-1956

## 2016-09-27 ENCOUNTER — Ambulatory Visit: Payer: BC Managed Care – PPO

## 2016-10-02 ENCOUNTER — Ambulatory Visit: Payer: BC Managed Care – PPO | Admitting: Physical Therapy

## 2016-10-02 DIAGNOSIS — M5412 Radiculopathy, cervical region: Secondary | ICD-10-CM

## 2016-10-02 DIAGNOSIS — R293 Abnormal posture: Secondary | ICD-10-CM

## 2016-10-02 DIAGNOSIS — M542 Cervicalgia: Secondary | ICD-10-CM

## 2016-10-02 NOTE — Therapy (Signed)
[facility] [address] [place], [place], [address] Phone: [phone]   Fax:  [phone]  Physical Therapy Treatment  Patient Details  Name: Heidi Mcguire MRN: [id number] Date of Birth: 23-Aug-1955 Referring Provider: Carolynn Comment , MD  Encounter Date: 10/02/2016      PT End of Session - 10/02/16 1547    Visit Number 3   Number of Visits 12   Date for PT Re-Evaluation 10/26/16   Authorization Type BCBS   PT Start Time 0345   PT Stop Time 0423   PT Time Calculation (min) 38 min      Past Medical History:  Diagnosis Date  . 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 Depression 05/08/2016  . Hyperlipidemia 05/08/2016  . Intractable chronic post-traumatic headache 05/08/2016   October 29, 2014 fell off a chair onto concrete floor  . Obstructive sleep apnea 05/08/2016    Past Surgical History:  Procedure Laterality Date  . ABDOMINAL HYSTERECTOMY  1992  . KNEE ARTHROSCOPY  2012    There were no vitals filed for this visit.      Subjective Assessment - 10/02/16 1548    Subjective Pain still radiates to elbow but it less intense. Could be the meds because I am taking them regularly.    Currently in Pain? Yes   Pain Score 3    Pain Location Arm   Pain Orientation Left   Pain Descriptors / Indicators Sharp   Pain Frequency Intermittent   Aggravating Factors  positioning/slouching    Pain Relieving Factors good posture, meds, stretching                          [facility] Adult PT Treatment/Exercise - 10/02/16 0001      Neck Exercises: Seated   Postural Training scap retraction x 10   reports N/T left hand      Neck Exercises: Supine   Neck Retraction 5 reps   Neck Retraction Limitations 1 pillow, small motions    Other Supine Exercise supine red band horizontal abduction, ER, pullovers x 10 each without increased symptoms      Traction   Min (lbs) 8  10 at most    Max (lbs)  15   Hold Time 60   Rest Time 10   Time 9  2 towels folded under left upper arm, 5 degree angle                  PT Short Term Goals - 09/18/16 1616      PT SHORT TERM GOAL #1   Title She will be independent with inital HEP   Time 3   Period Weeks   Status New     PT SHORT TERM GOAL #2   Title She will report pain improved 30% or more with use of LT arm for reaching   Time 3   Period Weeks   Status New     PT SHORT TERM GOAL #3   Title She will demo understanding of goo cervical posture   Time 3   Period Weeks   Status New           PT Long Term Goals - 09/18/16 1617      PT LONG TERM GOAL #1   Title She will be independent with all HEP issued   Time 6   Period Weeks     PT LONG TERM GOAL #2  Title She will repor pain as intermittant and decreased 75% or more   Time 6   Period Weeks   Status New     PT LONG TERM GOAL #3   Title she will have 1-2/10 max pain with work tasks    Time 6   Period Weeks   Status New     PT LONG TERM GOAL #4   Title She will have 1-2 max pain with reaching and lifting into cabinets  and at work   Time 6   Period Weeks   Status New     PT LONG TERM GOAL #5   Title she will be able to lye flat for sleep with 1-2/10 max pain without Lt arm supported.    Time 6   Period Weeks   Status New     Additional Long Term Goals   Additional Long Term Goals Yes     PT LONG TERM GOAL #6   Title she will be able to use computer without LT ar pain   Time 6   Period Weeks   Status New               Plan - 10/02/16 1553    Clinical Impression Statement Pt reports decreased intensity of left arm pain and feels traction is helpful. She has noticed recent onset of muscle fatigue and decreased endurance. She plans to check the side effects of new medications she is taking. Began supine red band scap stab without radicular symptoms. During seated scap squeezes pt reports onset of N/T in left hand. Postioned pt on  traction at 5 degree angle and monitored her symptoms. She was able to toleate 9 minutes of traction. She could feel her symptoms increase and decrease with the pull of the traction. Increased traction pull to 15# and 7# for 2 minutes however pt reports discomfort at base of skull. This was releived by loosening the head rest. She still wanted to discontinue at 9 minutes today. She felt good upon sitting and then noted burning sensation at left shoulder blade. Will continue to assess response to traction.    PT Next Visit Plan REassess traction and advance if appropriate  , review and progress stabilization exercises as tolerated-add supine red band stab if she did well this visit. , modalites as needed   Consulted and Agree with Plan of Care Patient      Patient will benefit from skilled therapeutic intervention in order to improve the following deficits and impairments:  Decreased range of motion, Pain, Impaired UE functional use, Increased muscle spasms, Postural dysfunction, Decreased activity tolerance  Visit Diagnosis: Radiculopathy, cervical region  Cervicalgia  Abnormal posture     Problem List Patient Active Problem List   Diagnosis Date Noted  . Cervical disc disorder with radiculopathy of cervical region 09/05/2016  . Vaginal atrophy 08/03/2016  . Severe obesity (BMI >= 40) (HCC) 08/03/2016  . Constipation 05/11/2016  . Hyperlipidemia 05/08/2016  . 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 Intractable chronic post-traumatic headache 05/08/2016  . Depression 05/08/2016  . Obstructive sleep apnea 05/08/2016  . Abnormal TSH 05/08/2016    Sherrie Mustacheonoho, Jessica McGee, PTA 10/02/2016, 4:47 PM  [facility] [address] [place], [place], [address] Phone: [phone]   Fax:  [phone]  Name: Heidi Mcguire MRN: [id number] Date of Birth: 1955/11/18

## 2016-10-04 ENCOUNTER — Ambulatory Visit: Payer: BC Managed Care – PPO

## 2016-10-04 DIAGNOSIS — R293 Abnormal posture: Secondary | ICD-10-CM

## 2016-10-04 DIAGNOSIS — M5412 Radiculopathy, cervical region: Secondary | ICD-10-CM | POA: Diagnosis not present

## 2016-10-04 DIAGNOSIS — M542 Cervicalgia: Secondary | ICD-10-CM

## 2016-10-04 NOTE — Therapy (Signed)
[facility] [address] [place], [place], [address] Phone: [phone]   Fax:  [phone]  Physical Therapy Treatment  Patient Details  Name: Heidi Mcguire MRN: [id number] Date of Birth: 1955/12/04 Referring Provider: Carolynn CommentBryan Strelow , MD  Encounter Date: 10/04/2016      PT End of Session - 10/04/16 1701    Visit Number 4   Number of Visits 12   Date for PT Re-Evaluation 10/26/16   Authorization Type BCBS   PT Start Time 0440  late   PT Stop Time 0508   PT Time Calculation (min) 28 min   Activity Tolerance Patient tolerated treatment well   Behavior During Therapy [facility] for tasks assessed/performed      Past Medical History:  Diagnosis Date  . 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 Depression 05/08/2016  . Hyperlipidemia 05/08/2016  . Intractable chronic post-traumatic headache 05/08/2016   October 29, 2014 fell off a chair onto concrete floor  . Obstructive sleep apnea 05/08/2016    Past Surgical History:  Procedure Laterality Date  . ABDOMINAL HYSTERECTOMY  1992  . KNEE ARTHROSCOPY  2012    There were no vitals filed for this visit.      Subjective Assessment - 10/04/16 1640    Subjective Doing alot better with intermittant pain now and pain is short duration. Onset of pain with reaching to computer . Hand tingle now.    Currently in Pain? Yes   Pain Score 2    Pain Location Arm   Pain Orientation Left   Pain Descriptors / Indicators Tingling   Pain Type --  sub acute   Pain Radiating Towards LT hand   Pain Frequency Intermittent   Aggravating Factors  reaching /slouching   Pain Relieving Factors meds posture , stretching            [facility] PT Assessment - 10/04/16 0001      AROM   Cervical Flexion 52   Cervical Extension 55   Cervical - Right Side Bend 40   Cervical - Left Side Bend 30   Cervical - Right Rotation 60   Cervical - Left Rotation 60                      [facility] Adult PT Treatment/Exercise - 10/04/16 0001      Traction   Min (lbs) 5   Max (lbs) 14   Hold Time 60   Rest Time 10   Time 15                  PT Short Term Goals - 10/04/16 1703      PT SHORT TERM GOAL #1   Title She will be independent with inital HEP   Status On-going     PT SHORT TERM GOAL #2   Title She will report pain improved 30% or more with use of LT arm for reaching   Status Achieved     PT SHORT TERM GOAL #3   Title She will demo understanding of good cervical posture   Status Achieved           PT Long Term Goals - 09/18/16 1617      PT LONG TERM GOAL #1   Title She will be independent with all HEP issued   Time 6   Period Weeks     PT LONG TERM GOAL #2   Title She will repor pain as intermittant and  decreased 75% or more   Time 6   Period Weeks   Status New     PT LONG TERM GOAL #3   Title she will have 1-2/10 max pain with work tasks    Time 6   Period Weeks   Status New     PT LONG TERM GOAL #4   Title She will have 1-2 max pain with reaching and lifting into cabinets  and at work   Time 6   Period Weeks   Status New     PT LONG TERM GOAL #5   Title she will be able to lye flat for sleep with 1-2/10 max pain without Lt arm supported.    Time 6   Period Weeks   Status New     Additional Long Term Goals   Additional Long Term Goals Yes     PT LONG TERM GOAL #6   Title she will be able to use computer without LT ar pain   Time 6   Period Weeks   Status New               Plan - 10/04/16 1702    Clinical Impression Statement Improved and able to tolerate traction for 15 min without incr pain.  14 pounds was goos today and she stated the pull felt good. she appears to be aware of good posture.   PT Treatment/Interventions Cryotherapy;Electrical Stimulation;Iontophoresis 4mg /ml Dexamethasone;Moist Heat;Ultrasound;Patient/family education;Passive range of motion;Manual  techniques;Therapeutic exercise;Dry needling   PT Next Visit Plan traction  advance if appropriate same if helpful  , review and progress stabilization exercises as tolerated-add supine red band stab if she did well this visit. , modalites as needed   Consulted and Agree with Plan of Care Patient      Patient will benefit from skilled therapeutic intervention in order to improve the following deficits and impairments:  Decreased range of motion, Pain, Impaired UE functional use, Increased muscle spasms, Postural dysfunction, Decreased activity tolerance  Visit Diagnosis: Radiculopathy, cervical region  Cervicalgia  Abnormal posture     Problem List Patient Active Problem List   Diagnosis Date Noted  . Cervical disc disorder with radiculopathy of cervical region 09/05/2016  . Vaginal atrophy 08/03/2016  . Severe obesity (BMI >= 40) (HCC) 08/03/2016  . Constipation 05/11/2016  . Hyperlipidemia 05/08/2016  . 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 Intractable chronic post-traumatic headache 05/08/2016  . Depression 05/08/2016  . Obstructive sleep apnea 05/08/2016  . Abnormal TSH 05/08/2016    Caprice Red  PT 10/04/2016, 5:05 PM  [facility] [address] [place], [place], [address] Phone: [phone]   Fax:  [phone]  Name: Heidi Mcguire MRN: [id number] Date of Birth: [birth year]

## 2016-10-08 ENCOUNTER — Ambulatory Visit: Payer: BC Managed Care – PPO

## 2016-10-11 ENCOUNTER — Ambulatory Visit: Payer: BC Managed Care – PPO | Attending: Internal Medicine

## 2016-10-11 DIAGNOSIS — M5412 Radiculopathy, cervical region: Secondary | ICD-10-CM | POA: Diagnosis present

## 2016-10-11 DIAGNOSIS — M542 Cervicalgia: Secondary | ICD-10-CM | POA: Diagnosis present

## 2016-10-11 DIAGNOSIS — R293 Abnormal posture: Secondary | ICD-10-CM | POA: Diagnosis present

## 2016-10-11 NOTE — Therapy (Signed)
[facility] [address] [place], [place], [address] Phone: [phone]   Fax:  [phone]  Physical Therapy Treatment  Patient Details  Name: Heidi Mcguire MRN: [id number] Date of Birth: 01/06/1956 Referring Provider: Carolynn Comment , MD  Encounter Date: 10/11/2016      PT End of Session - 10/11/16 1712    Visit Number 5   Number of Visits 12   Date for PT Re-Evaluation 10/26/16   Authorization Type BCBS   PT Start Time 0430   PT Stop Time 0502   PT Time Calculation (min) 32 min   Activity Tolerance Patient tolerated treatment well   Behavior During Therapy [facility] for tasks assessed/performed      Past Medical History:  Diagnosis Date  . 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 Depression 05/08/2016  . Hyperlipidemia 05/08/2016  . Intractable chronic post-traumatic headache 05/08/2016   October 29, 2014 fell off a chair onto concrete floor  . Obstructive sleep apnea 05/08/2016    Past Surgical History:  Procedure Laterality Date  . ABDOMINAL HYSTERECTOMY  1992  . KNEE ARTHROSCOPY  2012    There were no vitals filed for this visit.      Subjective Assessment - 10/11/16 1716    Subjective Doing alot better with intermittant pain now and pain is short duration.  . Hand tingle minmally  Feel good   Currently in Pain? Yes   Pain Score 1    Pain Location Arm   Pain Orientation Left   Pain Descriptors / Indicators Tingling   Pain Type --  sub acute   Pain Onset More than a month ago   Pain Frequency Intermittent   Aggravating Factors  reaching , slouch   Pain Relieving Factors posture /meds stretching   Multiple Pain Sites No                         [facility] Adult PT Treatment/Exercise - 10/11/16 0001      Traction   Type of Traction Cervical   Min (lbs) 5   Max (lbs) 15   Hold Time 60   Rest Time 10   Time 18                  PT Short Term Goals - 10/11/16  1715      PT SHORT TERM GOAL #1   Title She will be independent with inital HEP   Status Achieved     PT SHORT TERM GOAL #2   Title She will report pain improved 30% or more with use of LT arm for reaching   Status Achieved     PT SHORT TERM GOAL #3   Title She will demo understanding of good cervical posture   Status Achieved           PT Long Term Goals - 10/11/16 1715      PT LONG TERM GOAL #1   Title She will be independent with all HEP issued   Status On-going     PT LONG TERM GOAL #2   Title She will repor pain as intermittant and decreased 75% or more   Status Achieved     PT LONG TERM GOAL #3   Title she will have 1-2/10 max pain with work tasks    Status On-going     PT LONG TERM GOAL #4   Title She will have 1-2 max pain with reaching and  lifting into cabinets  and at work   Status On-going     PT LONG TERM GOAL #5   Title she will be able to lye flat for sleep with 1-2/10 max pain without Lt arm supported.    Status On-going     PT LONG TERM GOAL #6   Title she will be able to use computer without LT arm pain   Status On-going               Plan - 10/11/16 1713    Clinical Impression Statement No pain or tingling post . She is doing much better . Will incr time next visit if not relapsed. She des not really want treatments other than traction as this seems to help and she is doing stretches issued earlier   PT Treatment/Interventions Cryotherapy;Electrical Stimulation;Iontophoresis 4mg /ml Dexamethasone;Moist Heat;Ultrasound;Patient/family education;Passive range of motion;Manual techniques;Therapeutic exercise;Dry needling   PT Next Visit Plan Advance pull or time next visit and contiue x2 and if remains improved will probably discharge      Consulted and Agree with Plan of Care Patient      Patient will benefit from skilled therapeutic intervention in order to improve the following deficits and impairments:  Decreased range of motion, Pain,  Impaired UE functional use, Increased muscle spasms, Postural dysfunction, Decreased activity tolerance  Visit Diagnosis: Radiculopathy, cervical region  Cervicalgia  Abnormal posture     Problem List Patient Active Problem List   Diagnosis Date Noted  . Cervical disc disorder with radiculopathy of cervical region 09/05/2016  . Vaginal atrophy 08/03/2016  . Severe obesity (BMI >= 40) (HCC) 08/03/2016  . Constipation 05/11/2016  . Hyperlipidemia 05/08/2016  . 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 Intractable chronic post-traumatic headache 05/08/2016  . Depression 05/08/2016  . Obstructive sleep apnea 05/08/2016  . Abnormal TSH 05/08/2016    Caprice Redhasse, Elliana Bal M  PT 10/11/2016, 5:18 PM  [facility] [address] [place], [place], [address] Phone: [phone]   Fax:  [phone]  Name: Dorita SciaraMarjorie Liebig MRN: [id number] Date of Birth: 06/18/1956

## 2016-10-15 ENCOUNTER — Ambulatory Visit: Payer: BC Managed Care – PPO

## 2016-10-15 DIAGNOSIS — M5412 Radiculopathy, cervical region: Secondary | ICD-10-CM | POA: Diagnosis not present

## 2016-10-15 DIAGNOSIS — M542 Cervicalgia: Secondary | ICD-10-CM

## 2016-10-15 DIAGNOSIS — R293 Abnormal posture: Secondary | ICD-10-CM

## 2016-10-15 NOTE — Therapy (Signed)
[facility] [address] [place], [place], [address] Phone: [phone]   Fax:  [phone]  Physical Therapy Treatment  Patient Details  Name: Heidi Mcguire MRN: [id number] Date of Birth: 09-24-1955 Referring Provider: Carolynn Comment , MD  Encounter Date: 10/15/2016      PT End of Session - 10/15/16 1616    Visit Number 6   Number of Visits 12   Date for PT Re-Evaluation 10/26/16   PT Start Time 0345   PT Stop Time 0415   PT Time Calculation (min) 30 min   Activity Tolerance Patient tolerated treatment well;No increased pain   Behavior During Therapy WFL for tasks assessed/performed      Past Medical History:  Diagnosis Date  . 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 Depression 05/08/2016  . Hyperlipidemia 05/08/2016  . Intractable chronic post-traumatic headache 05/08/2016   October 29, 2014 fell off a chair onto concrete floor  . Obstructive sleep apnea 05/08/2016    Past Surgical History:  Procedure Laterality Date  . ABDOMINAL HYSTERECTOMY  1992  . KNEE ARTHROSCOPY  2012    There were no vitals filed for this visit.      Subjective Assessment - 10/15/16 1613    Subjective No pain or arm symptoms  today and feels traction biggest help. After lying down she began to have some mild arm symptoms so arm was supported with  padding (2 sheets) and symptoms resolved   Currently in Pain? No/denies                         [facility] Adult PT Treatment/Exercise - 10/15/16 0001      Traction   Type of Traction Cervical   Min (lbs) 5   Max (lbs) 15   Hold Time 60   Rest Time 10   Time 20                  PT Short Term Goals - 10/11/16 1715      PT SHORT TERM GOAL #1   Title She will be independent with inital HEP   Status Achieved     PT SHORT TERM GOAL #2   Title She will report pain improved 30% or more with use of LT arm for reaching   Status Achieved     PT  SHORT TERM GOAL #3   Title She will demo understanding of good cervical posture   Status Achieved           PT Long Term Goals - 10/15/16 1619      PT LONG TERM GOAL #1   Title She will be independent with all HEP issued   Status On-going     PT LONG TERM GOAL #2   Title She will report pain as intermittant and decreased 75% or more   Status Achieved     PT LONG TERM GOAL #3   Title she will have 1-2/10 max pain with work tasks    Status On-going     PT LONG TERM GOAL #4   Title She will have 1-2 max pain with reaching and lifting into cabinets  and at work   Status Achieved     PT LONG TERM GOAL #5   Title she will be able to lye flat for sleep with 1-2/10 max pain without Lt arm supported.    Baseline improved but not 100% time   Status On-going  PT LONG TERM GOAL #6   Title she will be able to use computer without LT arm pain   Baseline improved   Status On-going               Plan - 10/15/16 1617    Clinical Impression Statement Continues improving except still symptoms in arm lying supine fleat with out arm support under upper arm. She toleraes more treatment time with traction and symptoms generally abssent.    PT Treatment/Interventions Cryotherapy;Electrical Stimulation;Iontophoresis 4mg /ml Dexamethasone;Moist Heat;Ultrasound;Patient/family education;Passive range of motion;Manual techniques;Therapeutic exercise;Dry needling   PT Next Visit Plan Advance pull or time next visit and contiue x2 and if remains improved will probably discharge   or put on hold for a week then discharge if still improved   Consulted and Agree with Plan of Care Patient      Patient will benefit from skilled therapeutic intervention in order to improve the following deficits and impairments:  Decreased range of motion, Pain, Impaired UE functional use, Increased muscle spasms, Postural dysfunction, Decreased activity tolerance  Visit Diagnosis: Radiculopathy, cervical  region  Cervicalgia  Abnormal posture     Problem List Patient Active Problem List   Diagnosis Date Noted  . Cervical disc disorder with radiculopathy of cervical region 09/05/2016  . Vaginal atrophy 08/03/2016  . Severe obesity (BMI >= 40) (HCC) 08/03/2016  . Constipation 05/11/2016  . Hyperlipidemia 05/08/2016  . 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 Intractable chronic post-traumatic headache 05/08/2016  . Depression 05/08/2016  . Obstructive sleep apnea 05/08/2016  . Abnormal TSH 05/08/2016    Caprice RedChasse, Karne Ozga M 10/15/2016, 4:20 PM  PT  [facility] [address] [place], [place], [address] Phone: [phone]   Fax:  [phone]  Name: Dorita SciaraMarjorie Waller MRN: [id number] Date of Birth: [birth year]

## 2016-10-18 ENCOUNTER — Ambulatory Visit: Payer: BC Managed Care – PPO

## 2016-10-18 DIAGNOSIS — R293 Abnormal posture: Secondary | ICD-10-CM

## 2016-10-18 DIAGNOSIS — M5412 Radiculopathy, cervical region: Secondary | ICD-10-CM

## 2016-10-18 DIAGNOSIS — M542 Cervicalgia: Secondary | ICD-10-CM

## 2016-10-18 NOTE — Therapy (Addendum)
[place], [place], [address] Phone: [phone]   Fax:  [phone]  Physical Therapy Treatment/ Discharge  Patient Details  Name: Heidi Mcguire MRN: [id number] Date of Birth: [birth year] Referring Provider: Holley Raring , MD  Encounter Date: 10/18/2016      PT End of Session - 10/18/16 1647    Visit Number 7   Number of Visits 12   Date for PT Re-Evaluation 10/26/16   Authorization Type BCBS   PT Start Time 0440   PT Stop Time 0508   PT Time Calculation (min) 28 min   Activity Tolerance Patient tolerated treatment well;No increased pain   Behavior During Therapy WFL for tasks assessed/performed      Past Medical History:  Diagnosis Date  . 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 Depression 05/08/2016  . Hyperlipidemia 05/08/2016  . Intractable chronic post-traumatic headache 05/08/2016   October 29, 2014 fell off a chair onto concrete floor  . Obstructive sleep apnea 05/08/2016    Past Surgical History:  Procedure Laterality Date  . ABDOMINAL HYSTERECTOMY  1992  . KNEE ARTHROSCOPY  2012    There were no vitals filed for this visit.      Subjective Assessment - 10/18/16 1643    Subjective Doing well ,still with Arm pain mainly with lying and reaching too much . She is 80% improved     Patient is accompained by: Interpreter   Currently in Pain? No/denies                         [facility] Adult PT Treatment/Exercise - 10/18/16 0001      Traction   Type of Traction Cervical   Min (lbs) 5   Max (lbs) 16   Hold Time 60   Rest Time 10   Time 20                  PT Short Term Goals - 10/11/16 1715      PT SHORT TERM GOAL #1   Title She will be independent with inital HEP   Status Achieved     PT SHORT TERM GOAL #2   Title She will report pain improved 30% or more with use of LT arm for reaching   Status Achieved     PT SHORT TERM GOAL  #3   Title She will demo understanding of good cervical posture   Status Achieved           PT Long Term Goals - 10/18/16 1649      PT LONG TERM GOAL #1   Title She will be independent with all HEP issued   Status On-going     PT LONG TERM GOAL #2   Title She will report pain as intermittant and decreased 75% or more   Status Achieved     PT LONG TERM GOAL #3   Title she will have 1-2/10 max pain with work tasks    Status Partially Met     PT LONG TERM GOAL #4   Title She will have 1-2 max pain with reaching and lifting into cabinets  and at work   Status Achieved     PT LONG TERM GOAL #5   Title she will be able to lye flat for sleep with 1-2/10 max pain without Lt arm supported.    Status On-going     PT LONG TERM GOAL #6  Title she will be able to use computer without LT arm pain   Status On-going               Plan - 10/18/16 1647    Clinical Impression Statement Continues improved . Per plan will hold for a week and may discharge after this is continues to be generally pain free.    PT Treatment/Interventions Cryotherapy;Electrical Stimulation;Iontophoresis 52m/ml Dexamethasone;Moist Heat;Ultrasound;Patient/family education;Passive range of motion;Manual techniques;Therapeutic exercise;Dry needling   PT Next Visit Plan Advance pull or time next visit and contiue x2 and if remains improved will probably discharge   or put on hold for a week then discharge if still improved   Consulted and Agree with Plan of Care Patient      Patient will benefit from skilled therapeutic intervention in order to improve the following deficits and impairments:  Decreased range of motion, Pain, Impaired UE functional use, Increased muscle spasms, Postural dysfunction, Decreased activity tolerance  Visit Diagnosis: Radiculopathy, cervical region  Cervicalgia  Abnormal posture     Problem List Patient Active Problem List   Diagnosis Date Noted  . Cervical disc  disorder with radiculopathy of cervical region 09/05/2016  . Vaginal atrophy 08/03/2016  . Severe obesity (BMI >= 40) ([place] 08/03/2016  . Constipation 05/11/2016  . Hyperlipidemia 05/08/2016  . 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 Intractable chronic post-traumatic headache 05/08/2016  . Depression 05/08/2016  . Obstructive sleep apnea 05/08/2016  . Abnormal TSH 05/08/2016    CDarrel Hoover PT  10/18/2016, 4:50 PM  [facility] [address]Phone: [phone]  Fax:  [phone] Name: Heidi MackowskiMRN: [id number] of Birth: [birth year] PHYSICAL THERAPY DISCHARGE SUMMARY  Visits from Start of Care: 7  Current functional level related to goals / functional outcomes: See above   Remaining deficits: See above She was pleased with progress   Education / Equipment: HEP Plan: Patient agrees to discharge.  Patient goals were met. Patient is being discharged due to being pleased with the current functional level.  ?????     SLillette BoxerChasse PT  10/30/16   11:43 AM

## 2016-10-30 ENCOUNTER — Ambulatory Visit: Payer: BC Managed Care – PPO

## 2016-11-16 ENCOUNTER — Other Ambulatory Visit: Payer: Self-pay | Admitting: *Deleted

## 2016-11-16 DIAGNOSIS — N951 Menopausal and female climacteric states: Secondary | ICD-10-CM

## 2016-11-16 MED ORDER — ESTRADIOL 10 MCG VA TABS: 1.0000 | ORAL_TABLET | VAGINAL | 1 refills | Status: DC

## 2016-12-27 ENCOUNTER — Ambulatory Visit (INDEPENDENT_AMBULATORY_CARE_PROVIDER_SITE_OTHER): Payer: BC Managed Care – PPO | Admitting: Internal Medicine

## 2016-12-27 ENCOUNTER — Encounter: Payer: Self-pay | Admitting: Internal Medicine

## 2016-12-27 DIAGNOSIS — K219 Gastro-esophageal reflux disease without esophagitis: Secondary | ICD-10-CM

## 2016-12-27 MED ORDER — PANTOPRAZOLE SODIUM 40 MG PO TBEC: 40.0000 mg | DELAYED_RELEASE_TABLET | Freq: Every day | ORAL | 1 refills | Status: DC

## 2016-12-27 MED ORDER — SIMETHICONE 80 MG PO CHEW: 80.0000 mg | CHEWABLE_TABLET | Freq: Four times a day (QID) | ORAL | 0 refills | Status: AC | PRN

## 2016-12-27 NOTE — Patient Instructions (Addendum)
Stop ibuprofen take tylenol if needed for pain.  Will treat with trial of Protonix. Take Gas X for bloating.   Come back in 6 weeks, if worsening, come back earlier.

## 2016-12-27 NOTE — Progress Notes (Signed)
   CC: burping/full ness  HPI:  [name] is a 61 y.o. with pmh as listed below is here with complaining of burping/feeling bloated.   Past Medical History:  Diagnosis Date  . 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 Depression 05/08/2016  . Hyperlipidemia 05/08/2016  . Intractable chronic post-traumatic headache 05/08/2016   October 29, 2014 fell off a chair onto concrete floor  . Obstructive sleep apnea 05/08/2016   Has 2 weeks of burping, getting full easily, abd pain on epigastric area and b/l upper quadrants which gets worse after eating but also worse when she is hungry. Pain is 5/10 at worst. After eating, it subsides when she drinks some water or eats a cracker. No chest pain, diarrhea, blood in the stool or melena, fever, weight loss, change in bowel habit. Symptoms started 2 weeks ago, no change in diet, only med change was increasing dose of gabapentin for her headaches.   Review of Systems:   Review of Systems  Constitutional: Negative for chills and fever.  Cardiovascular: Negative for chest pain.  Gastrointestinal: Positive for abdominal pain, heartburn and nausea. Negative for blood in stool, constipation, diarrhea, melena and vomiting.  Genitourinary: Negative for dysuria.     Physical Exam:  Vitals:   12/27/16 1419  BP: (!) 146/67  Pulse: 73  Temp: 98.3 F (36.8 C)  TempSrc: Oral  SpO2: 100%  Weight: 266 lb 12.8 oz (121 kg)  Height: 5\' 7"  (1.702 m)   Physical Exam  Constitutional: She is oriented to person, place, and time. She appears well-developed and well-nourished. No distress.  HENT:  Head: Normocephalic and atraumatic.  Eyes: Conjunctivae are normal.  Cardiovascular: Normal rate and regular rhythm.   GI:  Reproducible tenderness over the sternum and on the epigastric area. No abd distension, no rebound.   Musculoskeletal: Normal range of motion. She exhibits no edema.  Neurological: She is alert and oriented to person,  place, and time.  Skin: She is not diaphoretic.    Assessment & Plan:   See Encounters Tab for problem based charting.  Patient discussed with Dr. Criselda PeachesMullen

## 2016-12-27 NOTE — Assessment & Plan Note (Addendum)
Her symptoms of bloating, burping, easily feeling full along with tenderness on exam over he sternal area and epigastrium suggestive of GERd related symptoms. No alarming signs such as chest pain, dyspnea on exertion, no change in bowel habit, melena, hematochezia,vomitting, weight loss. She is on high dose ibuprofen.  Will hold ibuprofen, take tylenol if needed for pain.  Will treat with trial of PPI (uptitrate from ranitidine). Will also give her gas x for bloating.   F/up in 6 weeks, earlier if symptoms worsened (discussed with patient).

## 2016-12-28 NOTE — Progress Notes (Signed)
Internal Medicine Clinic Attending  Case discussed with Dr. Ahmed at the time of the visit.  We reviewed the resident's history and exam and pertinent patient test results.  I agree with the assessment, diagnosis, and plan of care documented in the resident's note. 

## 2017-01-04 ENCOUNTER — Encounter: Payer: Self-pay | Admitting: Pulmonary Disease

## 2017-01-04 ENCOUNTER — Telehealth: Payer: Self-pay | Admitting: *Deleted

## 2017-01-04 MED ORDER — ESCITALOPRAM OXALATE 20 MG PO TABS: 20.0000 mg | ORAL_TABLET | Freq: Every day | ORAL | 0 refills | Status: DC

## 2017-01-08 NOTE — Telephone Encounter (Signed)
Pt needs appt per dr Isabella Bowenskrall

## 2017-01-14 NOTE — Telephone Encounter (Signed)
Dr. Isabella BowensKrall has no available appointment slots at this time and she will be graduating in June.  Can the patient wait until July to see new pcp or [facility]?  Please advise.

## 2017-01-15 NOTE — Telephone Encounter (Signed)
Ok to wait until July

## 2017-01-28 ENCOUNTER — Encounter: Payer: Self-pay | Admitting: *Deleted

## 2017-02-21 ENCOUNTER — Encounter: Payer: BC Managed Care – PPO | Admitting: Internal Medicine

## 2017-03-07 ENCOUNTER — Encounter: Payer: Self-pay | Admitting: Internal Medicine

## 2017-03-07 ENCOUNTER — Ambulatory Visit (INDEPENDENT_AMBULATORY_CARE_PROVIDER_SITE_OTHER): Payer: BC Managed Care – PPO | Admitting: Internal Medicine

## 2017-03-07 VITALS — BP 136/71 | HR 84 | Temp 98.6°F | Ht 67.0 in | Wt 272.3 lb

## 2017-03-07 DIAGNOSIS — K219 Gastro-esophageal reflux disease without esophagitis: Secondary | ICD-10-CM | POA: Diagnosis not present

## 2017-03-07 DIAGNOSIS — G44321 Chronic post-traumatic headache, intractable: Secondary | ICD-10-CM

## 2017-03-07 DIAGNOSIS — Z0000 Encounter for general adult medical examination without abnormal findings: Secondary | ICD-10-CM | POA: Diagnosis not present

## 2017-03-07 DIAGNOSIS — E785 Hyperlipidemia, unspecified: Secondary | ICD-10-CM | POA: Diagnosis not present

## 2017-03-07 DIAGNOSIS — N952 Postmenopausal atrophic vaginitis: Secondary | ICD-10-CM | POA: Diagnosis not present

## 2017-03-07 MED ORDER — ATORVASTATIN CALCIUM 40 MG PO TABS: 40.0000 mg | ORAL_TABLET | Freq: Every day | ORAL | 11 refills | Status: DC

## 2017-03-07 NOTE — Assessment & Plan Note (Signed)
Assessment Well controlled on zantac BID.  Plan Continue zantac BID.

## 2017-03-07 NOTE — Assessment & Plan Note (Signed)
Assessment Uses vaginal cream. Is due for pap smear. Will check for gynecologist's name. If she does not find it, will call clinic and we can place another referral.  Plan - Continue vaginal cream. - Gyn referral if needed.

## 2017-03-07 NOTE — Assessment & Plan Note (Addendum)
Assessment 4.9% 10-year risk of heart disease or stroke. 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 Says the hives were on her chest and back, unsure if they were on her arms and legs. Patient is interested in trying a different statin to see if she can tolerate it. She is aware to stop the statin and call us if she gets hives, rashes, fevers, muscle aches or anything else with the statin.  Plan - LFTs drawn for baseline liver function - atorvastatin 40mg  QHS - Continue lifestyle modification.

## 2017-03-07 NOTE — Assessment & Plan Note (Signed)
Assessment Larey SeatFell of a chair 2 years ago. Tried sumatriptan, which made the HA worse. Currently taking gabapentin and topamax. Is now having daily headaches that requires tramadol for relief. The tramadol does help. - Se[facility] a [facility] at [facility]. - She is trying Cefaly for 2 months. She started it 1 month ago and has not noticed any benefit yet. - She was told the next step could be to try an injection (?erenumab - mAb against calcitonin gene-related peptide) - wants a second opinion from a local neurologist  Plan - Referral placed for Nita Sickleonika Patel at [facility].

## 2017-03-07 NOTE — Patient Instructions (Addendum)
It was a pleasure to meet you today, [name].  We are sending a referral for a local neurologist. Her name is Heidi Mcguire. They will contact you to schedule an appointment.  You are due for your mammogram. We will schedule an appointment for you.  Please look for your gynecologist's name. If you cannot find it, please call the clinic and I can send in another referral for gynecology.  We are starting a new cholesterol medicine for you. Please take atorvastatin (Lipitor) 40mg  once at night. If you notice any hives, rashes, fevers, or muscle aches, stop taking the atorvastatin and let us know.  We will see you in 4 months.   Preventing High Cholesterol Cholesterol is a waxy, fat-like substance that your body needs in small amounts. Your liver makes all the cholesterol that your body needs. Having high cholesterol (hypercholesterolemia) increases your risk for heart disease and stroke. Extra (excess) cholesterol comes from the food you eat, such as animal-based fat (saturated fat) from meat and some dairy products. High cholesterol can often be prevented with diet and lifestyle changes. If you already have high cholesterol, you can control it with diet and lifestyle changes, as well as medicine. What nutrition changes can be made?  Eat less saturated fat. Foods that contain saturated fat include red meat and some dairy products.  Avoid processed meats, like bacon and lunch meats.  Avoid trans fats, which are found in margarine and some baked goods.  Avoid foods and beverages that have added sugars.  Eat more fruits, vegetables, and whole grains.  Choose healthy sources of protein, such as fish, poultry, and nuts.  Choose healthy sources of fat, such as: ? Nuts. ? Vegetable oils, especially olive oil. ? Fish that have healthy fats (omega-3 fatty acids), such as mackerel or salmon. What lifestyle changes can be made?  Lose weight if you are overweight. Losing 5-10 lb (2.3-4.5 kg) can  help prevent or control high cholesterol and reduce your risk for diabetes and high blood pressure. Ask your health care provider to help you with a diet and exercise plan to safely lose weight.  Get enough exercise. Do at least 150 minutes of moderate-intensity exercise each week. ? You could do this in short exercise sessions several times a day, or you could do longer exercise sessions a few times a week. For example, you could take a brisk 10-minute walk or bike ride, 3 times a day, for 5 days a week.  Do not smoke. If you need help quitting, ask your health care provider.  Limit your alcohol intake. If you drink alcohol, limit alcohol intake to no more than 1 drink a day for nonpregnant women and 2 drinks a day for men. One drink equals 12 oz of beer, 5 oz of wine, or 1 oz of hard liquor. Why are these changes important? If you have high cholesterol, deposits (plaques) may build up on the walls of your blood vessels. Plaques make the arteries narrower and stiffer, which can restrict or block blood flow and cause blood clots to form. This greatly increases your risk for heart attack and stroke. Making diet and lifestyle changes can reduce your risk for these life-threatening conditions. What can I do to lower my risk?  Manage your risk factors for high cholesterol. Talk with your health care provider about all of your risk factors and how to lower your risk.  Manage other conditions that you have, such as diabetes or high blood pressure (hypertension).  Have your cholesterol checked at regular intervals.  Keep all follow-up visits as told by your health care provider. This is important. How is this treated? In addition to diet and lifestyle changes, your health care provider may recommend medicines to help lower cholesterol, such as a medicine to reduce the amount of cholesterol made in your liver. You may need medicine if:  Diet and lifestyle changes do not lower your cholesterol  enough.  You have high cholesterol and other risk factors for heart disease or stroke.  Take over-the-counter and prescription medicines only as told by your health care provider. Where to find more information:  American Heart Association: 1122334455www.heart.org/HEARTORG/Conditions/Cholesterol/Cholesterol_UCM_001089_SubHomePage.jsp  National Heart, Lung, and Blood Institute: http://hood.com/www.nhlbi.nih.gov/health/resources/heart/heart-cholesterol-hbc-what-html Summary  High cholesterol increases your risk for heart disease and stroke. By keeping your cholesterol level low, you can reduce your risk for these conditions.  Diet and lifestyle changes are the most important steps in preventing high cholesterol.  Work with your health care provider to manage your risk factors, and have your blood tested regularly. This information is not intended to replace advice given to you by your health care provider. Make sure you discuss any questions you have with your health care provider. Document Released: 08/14/2015 Document Revised: 04/07/2016 Document Reviewed: 04/07/2016 Elsevier Interactive Patient Education  [employer]2018 Elsevier Inc.

## 2017-03-07 NOTE — Progress Notes (Signed)
   CC: chronic headaches  HPI:  Heidi Mcguire is a 61 y.o. female with PMH significant for chronic post-traumatic headaches, depression, OSA (on CPAP), and HLD who presents for management of chronic headaches and health maintenance.  Please see the assessment and plan below for the status of the patient's chronic medical problems.  Past Medical History:  Diagnosis Date  . 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 Depression 05/08/2016  . Hyperlipidemia 05/08/2016  . Intractable chronic post-traumatic headache 05/08/2016   October 29, 2014 fell off a chair onto concrete floor  . Obstructive sleep apnea 05/08/2016   Social History: Denies smoking or other illicit drugs. Endorses rare alcohol use. Moved from [place] 2 years ago to buy more land here. Has a tractor! Has 4 sons, one lives in [place]. Has a black and white cat. Lives at home alone.  Review of Systems:  Constitutional: Negative for diaphoresis, fever, malaise/fatigue, and weight loss.  HEENT: Negative for blurred vision, hearing loss, sinus pain, congestion, and sore throat. Respiratory: Negative for cough, shortness of breath and wheezing.   Cardiovascular: Negative for chest pain, palpitations, orthopnea, PND, and leg swelling..  Gastrointestinal: Negative for abdominal pain, blood in stool, diarrhea, heartburn, nausea and vomiting. Positive for constipation that is controlled with stool softener and probiotics. Genitourinary: Negative for dysuria and hematuria.  Musculoskeletal: Negative for joint pain and myalgias.  Neurological: Negative for dizziness, focal weakness, weakness. Positive for chronic headaches. Psych: Denies suicidal or homicidal ideation. Endorses 1 "down" day per month.  Physical Exam:  Vitals:   03/07/17 1501  BP: 136/71  Pulse: 84  Temp: 98.6 F (37 C)  TempSrc: Oral  SpO2: 97%  Weight: 272 lb 4.8 oz (123.5 kg)  Height: 5\' 7"  (1.702 m)   GEN: Well-appearing obese  woman sitting comfortably in chair in no acute distress. Alert and oriented. HENT: Moist mucous membranes. No visible lesions. EYES: EOMI. Sclera anicteric. PERRL RESP: Clear to auscultation bilaterally. No wheezes, rales, or rhonchi. CV: Normal rate and regular rhythm. No murmurs, gallops, or rubs. No LE edema. ABD: Soft. Non-tender. Non-distended. Normoactive bowel sounds. EXT: No clubbing, cyanosis, or edema. Warm and well perfused. NEURO: Cranial nerves II-XII intact. Walks without assistance. 5/5 UE and LE strength bilaterally. Normal brachial and patellar reflexes. PSYCH: Patient is calm and pleasant. Appropriate affect. Well-groomed; speech is appropriate and on-subject.  Assessment & Plan:   See Encounters Tab for problem based charting.  Patient seen with Dr. Cyndie ChimeGranfortuna.  Heidi GerlachJennifer Sakai Wolford, MD Internal Medicine, PGY-1

## 2017-03-07 NOTE — Assessment & Plan Note (Signed)
Mammogram - Order placed  PAP smear - Patient to follow up with gynecologist  Colonoscopy - Last colonoscopy in chart was 04/2008. - No abdominal pain, blood in stool, or weight loss. - Due for next colonoscopy in 04/2018.

## 2017-03-08 ENCOUNTER — Encounter: Payer: Self-pay | Admitting: Neurology

## 2017-03-08 LAB — HEPATIC FUNCTION PANEL
ALT: 21 IU/L (ref 0–32)
AST: 26 IU/L (ref 0–40)
Albumin: 4.7 g/dL (ref 3.6–4.8)
Alkaline Phosphatase: 94 IU/L (ref 39–117)
BILIRUBIN, DIRECT: 0.08 mg/dL (ref 0.00–0.40)
Bilirubin Total: <0.2 mg/dL (ref 0.0–1.2)
Total Protein: 6.8 g/dL (ref 6.0–8.5)

## 2017-03-08 NOTE — Progress Notes (Signed)
Medicine attending: I personally interviewed and briefly examined this patient on the day of the patient visit and reviewed pertinent clinical ,laboratory data  with resident physician Dr. Scherrie GerlachJennifer Huang and we discussed a management plan. Lady who just [place] here [place]. Being followed there for chronic, post traumatic headaches and hyperlipidemia. We will make local Neurology referral. She has a transcutaneous electric current stimulator that she applies intermittently to her forehead. She has had minor urticarial reactions to gemfibrizol and simvastatin in the past. She is willing to try an alternative lipid lowering med.

## 2017-03-13 ENCOUNTER — Other Ambulatory Visit: Payer: Self-pay | Admitting: Internal Medicine

## 2017-03-13 DIAGNOSIS — Z1231 Encounter for screening mammogram for malignant neoplasm of breast: Secondary | ICD-10-CM

## 2017-04-07 ENCOUNTER — Encounter: Payer: Self-pay | Admitting: Internal Medicine

## 2017-04-08 ENCOUNTER — Other Ambulatory Visit: Payer: Self-pay | Admitting: *Deleted

## 2017-04-08 MED ORDER — ESCITALOPRAM OXALATE 20 MG PO TABS: 20.0000 mg | ORAL_TABLET | Freq: Every day | ORAL | 0 refills | Status: DC

## 2017-04-12 ENCOUNTER — Encounter: Payer: Self-pay | Admitting: Internal Medicine

## 2017-04-12 ENCOUNTER — Ambulatory Visit (INDEPENDENT_AMBULATORY_CARE_PROVIDER_SITE_OTHER): Payer: BC Managed Care – PPO | Admitting: Internal Medicine

## 2017-04-12 VITALS — BP 150/82 | HR 91 | Temp 98.2°F | Ht 67.0 in | Wt 268.9 lb

## 2017-04-12 DIAGNOSIS — Z79899 Other long term (current) drug therapy: Secondary | ICD-10-CM | POA: Diagnosis not present

## 2017-04-12 DIAGNOSIS — G44321 Chronic post-traumatic headache, intractable: Secondary | ICD-10-CM

## 2017-04-12 MED ORDER — GABAPENTIN 300 MG PO CAPS: 900.0000 mg | ORAL_CAPSULE | Freq: Three times a day (TID) | ORAL | 2 refills | Status: DC

## 2017-04-12 NOTE — Assessment & Plan Note (Signed)
Currently on Gabapentin 600 mg TID and Tramadol 50 mg TID. Headaches have since gotten worse. Per neurology note in 01/2017 she tried the Cefaly. She also reports trying topamax with no relief. As we wait for her to see neurology [place] in [place], we will increase her gabapentin dose. Discussed trying a TCA in the future.   Plan: - Increase Gabapentin in a step wise fashion to 900 mg TID  - Continue Tramadol 50 mg TID - Consider adding Amitriptyline in the future if she presents before her neurology appointment  - Told to call neurology to see if she could get in sooner

## 2017-04-12 NOTE — Patient Instructions (Signed)
It was a pleasure to meet you today. Today we:  - Increased your Gabapentin to 900 mg at bedtime. If this does not improve your headache we can increase to 900 mg TID over a couple weeks   - Gabapentin 600 mg, 600 mg, 900 mg for 1 week   - Gabapentin 600 mg, 900 mg, 900 mg for 1 week  - Gabapentin 900 mg, 900 mg, 900 mg for 1 week  - Call the neurology office and get on the wait list to be schedule if some cancels   - Please let us know if the changes we made do not help.

## 2017-04-12 NOTE — Progress Notes (Signed)
Internal Medicine Clinic Attending  I saw and evaluated the patient.  I personally confirmed the key portions of the history and exam documented by Dr. Helberg and I reviewed pertinent patient test results.  The assessment, diagnosis, and plan were formulated together and I agree with the documentation in the resident's note. 

## 2017-04-12 NOTE — Progress Notes (Signed)
   CC: Headaches  HPI:  [name] is a 61 y.o. female with a PMHx significant for chronic post-traumatic headaches presenting for continued evaluation and management of her chronic headaches. Approximately 2.5 years ago her struck her head and since has suffered from migraines and chronic headaches. She has tired multiple medications in the past including Topamax and sumatriptan with no releif. She recently tried Cefaly which only helped temporarily. She describes her clinical course as one in which she will try a new medication, it will work for a little while, and then headaches get worse. She typically follows with a neurology in [place] however she is trying to get into [facility] for a second opinion. Her appoint with Heidi Mcguire is for November 9th. She is currently taking Tramadol 50 mg and Gabapentin 600 mg TID.   Past Medical History:  Diagnosis Date  . 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 Depression 05/08/2016  . Hyperlipidemia 05/08/2016  . Intractable chronic post-traumatic headache 05/08/2016   October 29, 2014 fell off a chair onto concrete floor  . Obstructive sleep apnea 05/08/2016   Review of Systems  Constitutional: Negative.   HENT: Negative.   Respiratory: Negative.   Cardiovascular: Negative.   Gastrointestinal: Negative.   Genitourinary: Negative.   Musculoskeletal: Negative.   Neurological: Positive for headaches.   Physical Exam: Vitals:   04/12/17 1322  BP: (!) 150/82  Pulse: 91  Temp: 98.2 F (36.8 C)  TempSrc: Oral  SpO2: 98%  Weight: 268 lb 14.4 oz (122 kg)  Height: 5\' 7"  (1.702 m)   Physical Exam  Constitutional: She is oriented to person, place, and time. She appears well-developed and well-nourished.  HENT:  Head: Normocephalic and atraumatic.  Eyes: Pupils are equal, round, and reactive to light. Conjunctivae are normal.  Cardiovascular: Normal rate, regular rhythm, normal heart sounds and intact distal pulses.    Pulmonary/Chest: Effort normal and breath sounds normal.  Musculoskeletal: Normal range of motion. She exhibits no edema.  Neurological: She is alert and oriented to person, place, and time.   Assessment & Plan:   See Encounters Tab for problem based charting.  Patient seen with Dr. Rogelia Mcguire

## 2017-04-29 ENCOUNTER — Encounter: Payer: Self-pay | Admitting: Internal Medicine

## 2017-05-06 ENCOUNTER — Telehealth: Payer: Self-pay | Admitting: *Deleted

## 2017-05-06 NOTE — Telephone Encounter (Signed)
Pt calls and states she has a horrible h/a, new med dose not working, appt [facility] 9/25 at 1545

## 2017-05-07 ENCOUNTER — Ambulatory Visit (INDEPENDENT_AMBULATORY_CARE_PROVIDER_SITE_OTHER): Payer: BC Managed Care – PPO | Admitting: Internal Medicine

## 2017-05-07 ENCOUNTER — Telehealth: Payer: Self-pay | Admitting: Pulmonary Disease

## 2017-05-07 VITALS — BP 147/69 | HR 82 | Temp 98.3°F | Ht 67.0 in | Wt 271.6 lb

## 2017-05-07 DIAGNOSIS — Z9181 History of falling: Secondary | ICD-10-CM | POA: Diagnosis not present

## 2017-05-07 DIAGNOSIS — Z79899 Other long term (current) drug therapy: Secondary | ICD-10-CM | POA: Diagnosis not present

## 2017-05-07 DIAGNOSIS — M5010 Cervical disc disorder with radiculopathy, unspecified cervical region: Secondary | ICD-10-CM | POA: Diagnosis not present

## 2017-05-07 DIAGNOSIS — G4733 Obstructive sleep apnea (adult) (pediatric): Secondary | ICD-10-CM | POA: Diagnosis not present

## 2017-05-07 DIAGNOSIS — Z9989 Dependence on other enabling machines and devices: Secondary | ICD-10-CM

## 2017-05-07 DIAGNOSIS — F329 Major depressive disorder, single episode, unspecified: Secondary | ICD-10-CM

## 2017-05-07 DIAGNOSIS — G44321 Chronic post-traumatic headache, intractable: Secondary | ICD-10-CM

## 2017-05-07 MED ORDER — AMITRIPTYLINE HCL 25 MG PO TABS: 25.0000 mg | ORAL_TABLET | Freq: Every day | ORAL | 2 refills | Status: DC

## 2017-05-07 NOTE — Patient Instructions (Addendum)
Start taking amitriptyline  at bedtime. This medicine can make you sleepy. Be careful driving early in the morning for the first few days after you start taking this medicine until you see how it affects you.  For your gabapentin: Decrease your dose to  twice a day (morning and afternoon) for one week Then decreased your dose to  twice a day (morning and afternoon) for one week You can keep at this dose if it is providing relief for your neck radiculopathy, or stop the gabapentin after that.   Follow up in 4 weeks.

## 2017-05-07 NOTE — Telephone Encounter (Signed)
Spoke with pt and advised message from VS. Pt understood. I sent order to [facility] to change settings on CPAP. Nothing further is needed.

## 2017-05-07 NOTE — Telephone Encounter (Signed)
Spoke with pt, she states when she is trying to go to sleep she jolts awake a few times before she goes to sleep. She doesn't know if her gabapentin is the issue because they had to increase her dose. She thinks it may be that the pressure is not enough for her and causing this to happen,. Please advise VS

## 2017-05-07 NOTE — Telephone Encounter (Signed)
Can try increasing CPAP from 8 to 9 cm H2O.

## 2017-05-07 NOTE — Progress Notes (Signed)
   CC: headache  HPI:  [name] is a 61 y.o. with a PMH of OSA on CPAP, cervical disc disorder with radiculopathy, MDD, and chronic post-traumatic headache presenting for follow up on her headaches.  Patient fell back and hit her head on concrete about 2.5 years ago and since then has had chronic, progressive headaches and migraines. She has been followed by neurology at [facility] and previous treatments included Topamax, sumatriptan, rizatriptan, Cefaly device. Currently patient is on Tramadol  TID PRN (states she takes BID most days) and gabapentin  TID (increased one month ago). She also takes ibuprofen  BID most days for OA which does also help with her headaches. She states that the increase in gabapentin initially helped her headaches, however this improvement was short lasting. She describes her headaches as daily, occurring in different locations on her head, sometimes tension type, and other times migraine type with photophobia, phonophobia, and nausea. She denies neck strain or muscle spasms. She denies associated focal weakness, numbness, tingling, aura, vision or hearing changes. She denies falls or syncope.  She is scheduled to see [facility] in Nov.  Please see problem based Assessment and Plan for status of patients chronic conditions.  Past Medical History:  Diagnosis Date  . 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 Depression 05/08/2016  . Hyperlipidemia 05/08/2016  . Intractable chronic post-traumatic headache 05/08/2016   October 29, 2014 fell off a chair onto concrete floor  . Obstructive sleep apnea 05/08/2016    Review of Systems:   ROS Per HPI  Physical Exam:  Vitals:   05/07/17 1551  BP: (!) 147/69  Pulse: 82  Temp: 98.3 F (36.8 C)  TempSrc: Oral  SpO2: 98%  Weight: 271 lb 9.6 oz (123.2 kg)  Height:  (1.702 m)   GENERAL- alert, co-operative, appears as stated age, not in any distress. HEENT-  Atraumatic, normocephalic, PERRL, EOMI, oral mucosa appears moist, no occipital or cervical spine muscle spasms or tenderness  CARDIAC- RRR, no murmurs, rubs or gallops. RESP- Moving equal volumes of air, and clear to auscultation bilaterally, no wheezes or crackles. ABDOMEN- Soft, nontender, bowel sounds present. NEURO- No Cr N abnormality, strength and sensation intact throughout. EXTREMITIES- pulse 2+, symmetric, no pedal edema. SKIN- Warm, dry, no rash or lesion. PSYCH- Normal mood and affect, appropriate thought content and speech.  Assessment & Plan:   See Encounters Tab for problem based charting.   Patient discussed with Dr. Lorin Picket, MD Internal Medicine PGY2

## 2017-05-08 ENCOUNTER — Encounter: Payer: Self-pay | Admitting: Internal Medicine

## 2017-05-08 NOTE — Assessment & Plan Note (Signed)
Patient with worsening tension type and migraine type headaches originating from head trauma in 2016, that has become refractory to topamax, sumatriptan, rizatriptan, gabapentin, and Cefaly device. She is also chronically on ibuprofen which may be contributing to her headaches due to analgesic overuse. Patient awaiting to see [facility].  Plan: --since there have been some cases of gabapentin withdrawal, advised patient to taper off of gabapentin; will eliminate evening dose --continue tramadol  TID PRN --start amitriptyline  qhs - advised about inc sedation --advised to try to limit ibuprofen use if able  --f/u in 4 weeks

## 2017-05-09 ENCOUNTER — Ambulatory Visit (INDEPENDENT_AMBULATORY_CARE_PROVIDER_SITE_OTHER): Payer: BC Managed Care – PPO | Admitting: Internal Medicine

## 2017-05-09 ENCOUNTER — Encounter: Payer: Self-pay | Admitting: Internal Medicine

## 2017-05-09 VITALS — BP 156/80 | Ht 67.0 in | Wt 271.8 lb

## 2017-05-09 DIAGNOSIS — G44321 Chronic post-traumatic headache, intractable: Secondary | ICD-10-CM | POA: Diagnosis not present

## 2017-05-09 DIAGNOSIS — Z9223 Personal history of estrogen therapy: Secondary | ICD-10-CM | POA: Diagnosis not present

## 2017-05-09 DIAGNOSIS — N952 Postmenopausal atrophic vaginitis: Secondary | ICD-10-CM | POA: Diagnosis not present

## 2017-05-09 DIAGNOSIS — Z87440 Personal history of urinary (tract) infections: Secondary | ICD-10-CM

## 2017-05-09 DIAGNOSIS — Z79899 Other long term (current) drug therapy: Secondary | ICD-10-CM | POA: Diagnosis not present

## 2017-05-09 DIAGNOSIS — Z9181 History of falling: Secondary | ICD-10-CM | POA: Diagnosis not present

## 2017-05-09 DIAGNOSIS — G4733 Obstructive sleep apnea (adult) (pediatric): Secondary | ICD-10-CM

## 2017-05-09 DIAGNOSIS — N390 Urinary tract infection, site not specified: Secondary | ICD-10-CM | POA: Diagnosis not present

## 2017-05-09 DIAGNOSIS — M5010 Cervical disc disorder with radiculopathy, unspecified cervical region: Secondary | ICD-10-CM | POA: Diagnosis not present

## 2017-05-09 DIAGNOSIS — Z9989 Dependence on other enabling machines and devices: Secondary | ICD-10-CM | POA: Diagnosis not present

## 2017-05-09 DIAGNOSIS — F339 Major depressive disorder, recurrent, unspecified: Secondary | ICD-10-CM | POA: Diagnosis not present

## 2017-05-09 DIAGNOSIS — N3000 Acute cystitis without hematuria: Secondary | ICD-10-CM

## 2017-05-09 LAB — POCT URINALYSIS DIPSTICK
Bilirubin, UA: NEGATIVE
GLUCOSE UA: NEGATIVE
Ketones, UA: NEGATIVE
Nitrite, UA: NEGATIVE
PH UA: 7 (ref 5.0–8.0)
PROTEIN UA: NEGATIVE
Spec Grav, UA: 1.015 (ref 1.010–1.025)
Urobilinogen, UA: 0.2 E.U./dL

## 2017-05-09 MED ORDER — NITROFURANTOIN MONOHYD MACRO 100 MG PO CAPS: 100.0000 mg | ORAL_CAPSULE | Freq: Two times a day (BID) | ORAL | 0 refills | Status: AC

## 2017-05-09 NOTE — Progress Notes (Signed)
Internal Medicine Clinic Attending  Case discussed with Dr. Svalina  at the time of the visit.  We reviewed the resident's history and exam and pertinent patient test results.  I agree with the assessment, diagnosis, and plan of care documented in the resident's note.  

## 2017-05-09 NOTE — Patient Instructions (Signed)
Take Macrobid one pill twice a day for 5 days. If your symptoms don't resolve or you start having fevers, chills, flank pain, nausea, vomiting, please come back to the clinic.

## 2017-05-09 NOTE — Assessment & Plan Note (Addendum)
Patient with dysuria and suprapubic discomfort similar to prior UTIs, with no systemic symptoms. POC UA reveals small blood and large leukocytes. 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 Patient reports previously being successfully treated with Macrobid.  As this is first UTI in some time, no indication for culture at this point.  Plan: --Macrobid  BID x5 days --advised to f/u if symptoms persist or if she develops fever, N/V, flank pain

## 2017-05-09 NOTE — Progress Notes (Signed)
   CC: dysuria  HPI:  Heidi Mcguire is a 61 y.o. with a PMH of OSA on CPAP, cervical disc disorder with radiculopathy, MDD, and chronic post-traumatic headache presenting for evaluation of dysuria.  Patient endorses dysuria and suprapubic discomfort since this morning that is similar to her prior UTIs. She denies vaginal discharge, hematuria, flank pain, nausea, vomiting, fevers, chills. She endorses a past history of frequent UTIs 2/2 vaginal atrophy, however states UTI's have become rare since starting estrogen cream. This is her first presumed UTI in over a year. Previously she has been treated successfully with Macrobid.  Please see problem based Assessment and Plan for status of patients chronic conditions.  Past Medical History:  Diagnosis Date  . 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 Depression 05/08/2016  . Hyperlipidemia 05/08/2016  . Intractable chronic post-traumatic headache 05/08/2016   October 29, 2014 fell off a chair onto concrete floor  . Obstructive sleep apnea 05/08/2016    Review of Systems:   ROS Per HPI  Physical Exam:  Vitals:   05/09/17 1435  BP: (!) 156/80  SpO2: 98%  Weight: 271 lb 12.8 oz (123.3 kg)  Height:  (1.702 m)   GENERAL- alert, co-operative, appears as stated age, not in any distress. CARDIAC- RRR, no murmurs, rubs or gallops. RESP- Moving equal volumes of air, and clear to auscultation bilaterally, no wheezes or crackles. ABDOMEN- Soft, nontender, bowel sounds present, no CVA tenderness EXTREMITIES- pulse 2+, symmetric, no pedal edema.  Assessment & Plan:   See Encounters Tab for problem based charting.   Patient discussed with Dr. Lorin Picket, MD Internal Medicine PGY2

## 2017-05-10 NOTE — Progress Notes (Signed)
Internal Medicine Clinic Attending  Case discussed with Dr. Svalina  at the time of the visit.  We reviewed the resident's history and exam and pertinent patient test results.  I agree with the assessment, diagnosis, and plan of care documented in the resident's note.  

## 2017-05-13 ENCOUNTER — Telehealth: Payer: Self-pay | Admitting: Pulmonary Disease

## 2017-05-13 DIAGNOSIS — G4733 Obstructive sleep apnea (adult) (pediatric): Secondary | ICD-10-CM

## 2017-05-13 NOTE — Telephone Encounter (Signed)
Spoke with pt. I apologized for confusion. Order has been placed for new machine. Nothing further was needed.

## 2017-05-13 NOTE — Telephone Encounter (Signed)
Spoke with pt. She is aware that she will need an appointment in order to get a new CPAP machine. Pt does not want to do that at this time. Her current machine is working fine. Attempted to contact [place] with [facility]. There was no answer and I could not leave a message. Will try back.

## 2017-05-13 NOTE — Telephone Encounter (Signed)
Spoke with pt. She is needing a new CPAP machine. Pt is aware that we placed this order on 05/07/17. Nothing further was needed.

## 2017-05-14 NOTE — Telephone Encounter (Signed)
Spoke with Barbara Cower at [facility], aware of pt's preference.  Nothing further needed.

## 2017-05-17 ENCOUNTER — Encounter: Payer: Self-pay | Admitting: Internal Medicine

## 2017-05-27 ENCOUNTER — Ambulatory Visit: Payer: BC Managed Care – PPO

## 2017-06-11 ENCOUNTER — Ambulatory Visit: Payer: BC Managed Care – PPO

## 2017-06-20 ENCOUNTER — Ambulatory Visit
Admission: RE | Admit: 2017-06-20 | Discharge: 2017-06-20 | Disposition: A | Discharge status: Home / Self Care | Payer: BC Managed Care – PPO | Source: Ambulatory Visit | Attending: Internal Medicine | Admitting: Internal Medicine

## 2017-06-20 DIAGNOSIS — Z1231 Encounter for screening mammogram for malignant neoplasm of breast: Secondary | ICD-10-CM

## 2017-06-21 ENCOUNTER — Ambulatory Visit: Payer: BC Managed Care – PPO | Admitting: Neurology

## 2017-06-21 ENCOUNTER — Encounter: Payer: Self-pay | Admitting: Neurology

## 2017-06-21 VITALS — BP 160/90 | HR 111 | Ht 67.0 in | Wt 268.0 lb

## 2017-06-21 DIAGNOSIS — G44321 Chronic post-traumatic headache, intractable: Secondary | ICD-10-CM | POA: Diagnosis not present

## 2017-06-21 DIAGNOSIS — F329 Major depressive disorder, single episode, unspecified: Secondary | ICD-10-CM | POA: Diagnosis not present

## 2017-06-21 DIAGNOSIS — R030 Elevated blood-pressure reading, without diagnosis of hypertension: Secondary | ICD-10-CM | POA: Diagnosis not present

## 2017-06-21 DIAGNOSIS — F32A Depression, unspecified: Secondary | ICD-10-CM

## 2017-06-21 MED ORDER — NORTRIPTYLINE HCL 10 MG PO CAPS: 10.0000 mg | ORAL_CAPSULE | Freq: Every day | ORAL | 3 refills | Status: DC

## 2017-06-21 NOTE — Progress Notes (Signed)
NEUROLOGY CONSULTATION NOTE  Heidi Mcguire MRN: [id number] DOB: 05/24/1956  Referring provider: Dr. Renaldo ReelHuang Primary care provider: Dr. Renaldo ReelHuang  Reason for consult:  headache  HISTORY OF PRESENT ILLNESS: Heidi SciaraMarjorie Degen is a 61 year old female with OSA on CPAP, cervical disc disease with radiculopathy who presents for headaches.  History supplemented by PCP note.  CT and MRI of brain from 2016 were personally reviewed.  Onset: 2016, after falling backwards on a stool and hitting her head on the concrete floor.  CT of head from 11/24/14 was negative.  MRI of brain with and without contrast from 03/01/15 showed mild chronic small vessel ischemic changes and possible degenerative changes in the left TMJ, but otherwise unremarkable. Location:  holocephalic Quality:  sharp Intensity:  Moderate with severe fluctuations Aura:  no Prodrome:  no Postdrome:  no Associated symptoms:  Photophobia, phonophobia, some nausea.  She has not had any new worse headache of her life, waking up from sleep Duration:  Constant but severe fluctuations last 30 to 45 seconds Frequency:  Constant but severe fluctuations occur several times a day Frequency of abortive medication: daily Triggers/exacerbating factors:  Reading, driving, computer work Relieving factors:  Dim lighting, sleep Activity:  Aggravates with severe  Past NSAIDS:  no Past analgesics:  no Past abortive triptans:  sumatriptan, rizatriptan 10mg  Past muscle relaxants:  no Past anti-emetic:  no Past antihypertensive medications:  no Past antidepressant medications:  no Past anticonvulsant medications:  topiramate Past vitamins/Herbal/Supplements:  no Other past therapies:  Cefaly device  Current NSAIDS:  ibuprofen 800mg  Current analgesics:  Tramadol, Tylenol Current triptans:  no Current anti-emetic:  no Current muscle relaxants:  no Current anti-anxiolytic:  no Current sleep aide:  no Current Antihypertensive medications:  no Current  Antidepressant medications:  amitriptyline 25mg  (, Lexapro 20mg  Current Anticonvulsant medications:  gabapentin 600mg  three times daily Current Vitamins/Herbal/Supplements:  Migravent (buttebur/Mg/B2) Current Antihistamines/Decongestants:  no Other therapy:  no  Caffeine:  Coffee every morning Alcohol:  no Smoker:  no Diet:  She binges on junk food when she feels depressed.  She hydrates. Exercise:  no Depression/anxiety:  depression Sleep hygiene:  good Family history of headache:  No.  No personal history of headache.  PAST MEDICAL HISTORY: Past Medical History:  Diagnosis Date  . 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 Depression 05/08/2016  . Hyperlipidemia 05/08/2016  . Intractable chronic post-traumatic headache 05/08/2016   October 29, 2014 fell off a chair onto concrete floor  . Obstructive sleep apnea 05/08/2016    PAST SURGICAL HISTORY: Past Surgical History:  Procedure Laterality Date  . ABDOMINAL HYSTERECTOMY  1992  . KNEE ARTHROSCOPY  2012    MEDICATIONS: Current Outpatient Medications on File Prior to Visit  Medication Sig Dispense Refill  . atorvastatin (LIPITOR) 40 MG tablet Take 1 tablet (40 mg total) by mouth daily. 30 tablet 11  . Calcium Carbonate-Vitamin D (CALCIUM 500 + D) 500-125 MG-UNIT TABS Take 2 tablets by mouth daily.    Marland Kitchen. conjugated estrogens (PREMARIN) vaginal cream Place 1 Applicatorful vaginally daily.    Marland Kitchen. escitalopram (LEXAPRO) 20 MG tablet Take 1 tablet (20 mg total) by mouth daily. 90 tablet 0  . gabapentin (NEURONTIN) 300 MG capsule Take 3 capsules (900 mg total) by mouth 3 (three) times daily. 270 capsule 2  . ibuprofen (ADVIL,MOTRIN) 400 MG tablet Take 400 mg by mouth every 8 (eight) hours as needed.    . Multiple Vitamin (MULTIVITAMIN) capsule Take 1 capsule by mouth daily.    .Marland Kitchen  Nutritional Supplements (ADULT NUTRITIONAL SUPPLEMENT PO) Take 1 tablet by mouth 2 (two) times daily.    . ranitidine (ZANTAC) 150 MG  tablet Take 150 mg by mouth 2 (two) times daily.    . simethicone (MYLICON) 80 MG chewable tablet Chew 1 tablet (80 mg total) by mouth every 6 (six) hours as needed for flatulence. 30 tablet 0   No current facility-administered medications on file prior to visit.     ALLERGIES: Allergies  Allergen Reactions  . Bupropion     Headaches  . Esomeprazole Magnesium Diarrhea  . Gemfibrozil Hives  . Lansoprazole Diarrhea    diarrhea  . Omeprazole Magnesium Diarrhea  . Simvastatin Hives    hives    FAMILY HISTORY: Family History  Problem Relation Age of Onset  . Depression Mother   . Alzheimer's disease Father   . Depression Sister   . Bipolar disorder Sister     SOCIAL HISTORY: Social History   Socioeconomic History  . Marital status: Divorced    Spouse name: Not on file  . Number of children: 4  . Years of education: Not on file  . Highest education level: Master's degree (e.g., MA, MS, MEng, MEd, MSW, MBA)  Social Needs  . Financial resource strain: Not on file  . Food insecurity - worry: Not on file  . Food insecurity - inability: Not on file  . Transportation needs - medical: Not on file  . Transportation needs - non-medical: Not on file  Occupational History  . Occupation: Occupational therapist  Tobacco Use  . Smoking status: Never Smoker  . Smokeless tobacco: Never Used  Substance and Sexual Activity  . Alcohol use: Yes    Comment: Rarely.  . Drug use: No  . Sexual activity: Not Currently    Birth control/protection: None  Other Topics Concern  . Not on file  Social History Narrative   Divorced, lives alone. She has 4 sons, 1 cat. She drinks 3 cups of coffe a day, does not exercise. 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 She has a Event organisermasters degree.    REVIEW OF SYSTEMS: Constitutional: No fevers, chills, or sweats, no generalized fatigue, change in appetite Eyes: No visual changes, double vision, eye pain Ear, nose and throat: No hearing  loss, ear pain, nasal congestion, sore throat Cardiovascular: No chest pain, palpitations Respiratory:  No shortness of breath at rest or with exertion, wheezes GastrointestinaI: No nausea, vomiting, diarrhea, abdominal pain, fecal incontinence Genitourinary:  No dysuria, urinary retention or frequency Musculoskeletal:  No neck pain, back pain Integumentary: No rash, pruritus, skin lesions Neurological: as above Psychiatric: No depression, insomnia, anxiety Endocrine: No palpitations, fatigue, diaphoresis, mood swings, change in appetite, change in weight, increased thirst Hematologic/Lymphatic:  No purpura, petechiae. Allergic/Immunologic: no itchy/runny eyes, nasal congestion, recent allergic reactions, rashes  PHYSICAL EXAM: Vitals:   06/21/17 1501  BP: (!) 160/90  Pulse: (!) 111  SpO2: 96%   General: No acute distress.  Patient appears well-groomed.  Head:  Normocephalic/atraumatic Eyes:  fundi examined but not visualized Neck: supple, no paraspinal tenderness, full range of motion Back: No paraspinal tenderness Heart: regular rate and rhythm Lungs: Clear to auscultation bilaterally. Vascular: No carotid bruits. Neurological Exam: Mental status: alert and oriented to person, place, and time, recent and remote memory intact, fund of knowledge intact, attention and concentration intact, speech fluent and not dysarthric, language intact. Cranial nerves: CN I: not tested CN II: pupils equal, round and reactive to light, visual fields intact  CN III, IV, VI:  full range of motion, no nystagmus, no ptosis CN V: facial sensation intact CN VII: upper and lower face symmetric CN VIII: hearing intact CN IX, X: gag intact, uvula midline CN XI: sternocleidomastoid and trapezius muscles intact CN XII: tongue midline Bulk & Tone: normal, no fasciculations. Motor:  5/5 throughout  Sensation: temperature and vibration sensation intact. Deep Tendon Reflexes:  2+ throughout, toes  downgoing. Finger to nose testing:  Without dysmetria. Heel to shin:  Without dysmetria.  Gait:  Normal station and stride.  Able to turn and tandem walk. Romberg negative.  IMPRESSION: Chronic intractable posttraumatic headache Depression Elevated blood pressure  PLAN: 1.  Switch from amitriptyline to nortriptyline 10mg  at bedtime (usually better tolerated) 2.  Limit use of pain relievers to no more than 2 days out of week.  Try to avoid tramadol 3.  In addition to her current supplements, try CoQ10 4.  Refer to [facility] for counseling and cognitive behavioral therapy 5.  Exercise, caffeine cessation, continue hydration 6.  Follow up with Dr. Renaldo Reel regarding blood pressure 7.  Follow up in 3 months.  Thank you for allowing me to take part in the care of this patient.  Shon Millet, DO  CC:  Scherrie Gerlach, MD

## 2017-06-21 NOTE — Patient Instructions (Addendum)
  1.  Stop amitriptyline.  Instead, start nortriptyline 10mg  at bedtime.  Call in 4 weeks with update and we can adjust dose if needed. 2.  Stop tramadol if possible. 3.  Limit use of pain relievers to no more than 2 days out of the week.  These medications include acetaminophen, ibuprofen, triptans and narcotics.  This will help reduce risk of rebound headaches. 4.  Be aware of common food triggers such as processed sweets, processed foods with nitrites (such as deli meat, hot dogs, sausages), foods with MSG, alcohol (such as wine), chocolate, certain cheeses, certain fruits (dried fruits, bananas, some citrus fruit), vinegar, diet soda. 4.  Avoid caffeine 5.  Routine exercise 6.  Proper sleep hygiene 7.  Stay adequately hydrated with water 8.  Keep a headache diary. 9.  Maintain proper stress management. 10.  Do not skip meals. 11.  In addition to Migravent, consider coenzyme Q 10 100mg  three times daily 12.  We will refer [facility] to [facility] for counseling regarding depression and cognitive behavioral therapy for your headache.

## 2017-07-14 ENCOUNTER — Other Ambulatory Visit: Payer: Self-pay | Admitting: Internal Medicine

## 2017-07-15 MED ORDER — ESCITALOPRAM OXALATE 20 MG PO TABS: 20.0000 mg | ORAL_TABLET | Freq: Every day | ORAL | 0 refills | Status: DC

## 2017-08-20 ENCOUNTER — Ambulatory Visit: Payer: BC Managed Care – PPO | Admitting: Psychology

## 2017-10-17 ENCOUNTER — Other Ambulatory Visit: Payer: Self-pay | Admitting: Internal Medicine

## 2017-10-17 MED ORDER — ESCITALOPRAM OXALATE 20 MG PO TABS: 20.0000 mg | ORAL_TABLET | Freq: Every day | ORAL | 1 refills | Status: DC

## 2017-11-21 ENCOUNTER — Encounter: Payer: Self-pay | Admitting: Internal Medicine

## 2017-12-20 ENCOUNTER — Ambulatory Visit: Payer: Self-pay

## 2017-12-26 ENCOUNTER — Other Ambulatory Visit: Payer: Self-pay

## 2017-12-26 ENCOUNTER — Ambulatory Visit: Payer: Self-pay | Admitting: Internal Medicine

## 2017-12-26 ENCOUNTER — Encounter: Payer: Self-pay | Admitting: Internal Medicine

## 2017-12-26 VITALS — BP 160/72 | HR 82 | Temp 98.1°F | Wt 277.2 lb

## 2017-12-26 DIAGNOSIS — F330 Major depressive disorder, recurrent, mild: Secondary | ICD-10-CM

## 2017-12-26 DIAGNOSIS — E039 Hypothyroidism, unspecified: Secondary | ICD-10-CM

## 2017-12-26 DIAGNOSIS — Z9989 Dependence on other enabling machines and devices: Secondary | ICD-10-CM

## 2017-12-26 DIAGNOSIS — R45851 Suicidal ideations: Secondary | ICD-10-CM

## 2017-12-26 DIAGNOSIS — E02 Subclinical iodine-deficiency hypothyroidism: Secondary | ICD-10-CM

## 2017-12-26 DIAGNOSIS — R3129 Other microscopic hematuria: Secondary | ICD-10-CM

## 2017-12-26 DIAGNOSIS — Z79890 Hormone replacement therapy (postmenopausal): Secondary | ICD-10-CM

## 2017-12-26 DIAGNOSIS — I10 Essential (primary) hypertension: Secondary | ICD-10-CM

## 2017-12-26 DIAGNOSIS — F339 Major depressive disorder, recurrent, unspecified: Secondary | ICD-10-CM

## 2017-12-26 DIAGNOSIS — G44321 Chronic post-traumatic headache, intractable: Secondary | ICD-10-CM

## 2017-12-26 DIAGNOSIS — G4733 Obstructive sleep apnea (adult) (pediatric): Secondary | ICD-10-CM

## 2017-12-26 DIAGNOSIS — Z79899 Other long term (current) drug therapy: Secondary | ICD-10-CM

## 2017-12-26 DIAGNOSIS — E038 Other specified hypothyroidism: Secondary | ICD-10-CM

## 2017-12-26 DIAGNOSIS — G629 Polyneuropathy, unspecified: Secondary | ICD-10-CM | POA: Insufficient documentation

## 2017-12-26 LAB — GLUCOSE, CAPILLARY: GLUCOSE-CAPILLARY: 82 mg/dL (ref 65–99)

## 2017-12-26 MED ORDER — NORTRIPTYLINE HCL 25 MG PO CAPS: 25.0000 mg | ORAL_CAPSULE | Freq: Every day | ORAL | 0 refills | Status: DC

## 2017-12-26 MED ORDER — GABAPENTIN 300 MG PO CAPS: 600.0000 mg | ORAL_CAPSULE | Freq: Three times a day (TID) | ORAL | 2 refills | Status: AC

## 2017-12-26 NOTE — Progress Notes (Signed)
   CC: management of depression  HPI:  [name] is a 62 y.o. female with PMH of OSA on CPAP, HTN, depression, and intractable chronic post-traumatic headache who presents for management of depression.  Depression: Patient endorses anhedonia, weight gain, and suicidal ideation. She has a plan but states she knows she would never go through with it because of her grandkids. She denies HI. PHQ9 of 13 today. She has been on zoloft in the past and this eventually stopped working so she was switched to lexapro and has been on this for about 5 years. She feels that in the last few months it has not been as helpful. She feels that the driving force for her depression at this time in her life is the major change she has had in not being able to work due to her chronic post-traumatic headaches. She feels that she has worked so hard to get to her job and really enjoyed doing it and is now saddened that she can no longer concentrate enough to be able to work. She is interested in talking to a counselor and has done this in the past with improvement, however she does not have the finances to be able to afford this at this time.  Numbness: Patient endorses numbness, tingling, and burning pain in bilateral feet, as well as at the fingertips of her right>left hand. She has been using compression socks at night, which does provide some relief. She has also been using OTC Nurenza, which she started about 1 week ago with some relief.  Microscopic hematuria: Patient presented to urgent care on March 23 with complaints of dysuria, which she thought was another UTI. She had a UA done, which did not show evidence of UTI (no bacteria), however it did show microscopic hematuria. She denied gross hematuria and states that the dysuria improved after several days without any treatment. She denies painful urination or other urinary symptoms. No abdominal pain.  Please see the assessment and plan below for the status of the  patient's chronic medical problems.  Past Medical History:  Diagnosis Date  . 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 Depression 05/08/2016  . Hyperlipidemia 05/08/2016  . Intractable chronic post-traumatic headache 05/08/2016   October 29, 2014 fell off a chair onto concrete floor  . Obstructive sleep apnea 05/08/2016   Review of Systems:   GEN: Negative for fevers. Positive for weight gain CV: Negative for chest pain  PULM: Negative for cough or SOB EXT: Negative for LE swelling NEURO: Positive for numbness of bilateral feet PSYCH: Positive for depressed mood, anhedonia, and SI. Negative for HI  Physical Exam:  Vitals:   12/26/17 1607  BP: (!) 160/72  Pulse: 82  Temp: 98.1 F (36.7 C)  SpO2: 98%  Weight: 277 lb 3.2 oz (125.7 kg)   GEN: Sitting in chair in NAD, intermittently tearful CV: NR & RR, no m/r/g PULM: CTAB, no wheezes or rales ABD: Obese, soft, ND, +BS PSYCH: Depressed mood, intermittently tearful  Assessment & Plan:   See Encounters Tab for problem based charting.  Patient discussed with Dr. Cleda Daub

## 2017-12-26 NOTE — Assessment & Plan Note (Signed)
Stable. Compliant with CPAP - Continue CPAP

## 2017-12-26 NOTE — Assessment & Plan Note (Addendum)
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 No known hx of diabetes, although there are some notes in her chart that indicate she has prediabetes. No A1c on record.  Plan - Check hemoglobin A1c, TSH, CMP, and B12 - Continue gapabentin  ADDENDUM: HbA1c 5.7, CMP and B12 wnl. TSH slightly high at 6.85 with normal free T4. Labs consistent with subclinical hypothyroidism. With the level of elevation in TSH, I am not convinced that this is the cause of her neuropathy, however given that she does have symptoms that could be related to hypothyroidism (neuropathy, weight gain, fatigue), will start low dose levothyroxine and titrate dose to achieve goal of low-normal TSH. If no improvement in neuropathy at that dose, can discontinue levothyroxine and evaluate for other causes of neuropathy. Called patient to inform her of these results and of the plan. Patient agreeable. - Start levothyroxine daily before breakfast - RTC in 4-6 weeks - Check TSH at next visit - Adjust dose of levothyroxine to achieve goal of low-normal TSH - If no improvement in neuropathy at appropriate dose, discontinue levothyroxine

## 2017-12-26 NOTE — Patient Instructions (Addendum)
FOLLOW-UP INSTRUCTIONS When: 4 weeks For: management of depression What to bring: medications   Heidi Mcguire,  It was good to see you again.  For your numbness and tingling, we are checking some labs - I will let you know the results.  For the blood in your urine, we are repeating the test.  For your depression, please start taking nortriptyline  once a day. We will also work on what services we have available.  Please come back in about 4 weeks.

## 2017-12-26 NOTE — Assessment & Plan Note (Signed)
Assessment Patient continues to have chronic post-traumatic headaches that limit her ability to work. She has tried many therapies without much improvement. She is followed by Dr. Nicanor Alcon in [place]. She has been frustrated by her inability to work and is currently on Aimovig injections monthly, as well as symptomatic therapies.  Plan - Continue f/u with Dr. Sharilyn Sites - Continue tramadol PRN, Migravent, and Aimovig injections

## 2017-12-26 NOTE — Assessment & Plan Note (Signed)
Assessment PHQ9 of 13 today. Reports SI with a plan, but knows that she would never go through with the plan because of her grandchildren. Denies HI. Has been on zoloft, currently on lexapro. Depressive episode driven by frustration surrounding her inability to work 2/2 chronic headaches. Open to speaking to a counselor however unable to afford it at this time.  I would consider bupropion as an adjunctive therapy for her depression, however with her chronic post-traumatic headaches, I am hesitant to start this as this would decrease her seizure threshold. Thus, will start nortriptyline today - she has been on nortriptyline in the past, however it was on doses for neuropathic pain for her headaches rather than at the dose indicated for depression. Patient amenable to trying this today.  Plan - Continue lexapro  daily - Start nortriptyline  QHS - F/u in 4 weeks - Referral to SW placed to determine if there are any counselors/resources available for individuals who are restricted by finances

## 2017-12-26 NOTE — Assessment & Plan Note (Addendum)
Assessment Patient had painful microscopic hematuria without evidence of UTI in March 2019. No gross hematuria or other urinary symptoms since that time. No abdominal pain or fevers. Will recheck UA and determine need for further imaging/referrals.  Plan - Repeat UA  ADDENDUM: No hematuria on repeat UA. Called pt to inform her of result.

## 2017-12-27 LAB — CMP14 + ANION GAP
A/G RATIO: 2.2 (ref 1.2–2.2)
ALK PHOS: 93 IU/L (ref 39–117)
ALT: 32 IU/L (ref 0–32)
AST: 27 IU/L (ref 0–40)
Albumin: 4.7 g/dL (ref 3.6–4.8)
Anion Gap: 17 mmol/L (ref 10.0–18.0)
BUN/Creatinine Ratio: 15 (ref 12–28)
BUN: 13 mg/dL (ref 8–27)
Bilirubin Total: 0.4 mg/dL (ref 0.0–1.2)
CALCIUM: 9.9 mg/dL (ref 8.7–10.3)
CO2: 21 mmol/L (ref 20–29)
Chloride: 99 mmol/L (ref 96–106)
Creatinine, Ser: 0.84 mg/dL (ref 0.57–1.00)
GFR calc Af Amer: 87 mL/min/{1.73_m2} (ref >59)
GFR, EST NON AFRICAN AMERICAN: 75 mL/min/{1.73_m2} (ref >59)
GLOBULIN, TOTAL: 2.1 g/dL (ref 1.5–4.5)
Glucose: 80 mg/dL (ref 65–99)
Potassium: 4.4 mmol/L (ref 3.5–5.2)
Sodium: 137 mmol/L (ref 134–144)
Total Protein: 6.8 g/dL (ref 6.0–8.5)

## 2017-12-27 LAB — URINALYSIS, ROUTINE W REFLEX MICROSCOPIC
Bilirubin, UA: NEGATIVE
Glucose, UA: NEGATIVE
KETONES UA: NEGATIVE
Leukocytes, UA: NEGATIVE
Nitrite, UA: NEGATIVE
Protein, UA: NEGATIVE
RBC, UA: NEGATIVE
SPEC GRAV UA: 1.017 (ref 1.005–1.030)
Urobilinogen, Ur: 0.2 mg/dL (ref 0.2–1.0)
pH, UA: 7 (ref 5.0–7.5)

## 2017-12-27 LAB — HEMOGLOBIN A1C
ESTIMATED AVERAGE GLUCOSE: 117 mg/dL
HEMOGLOBIN A1C: 5.7 % — AB (ref 4.8–5.6)

## 2017-12-27 LAB — TSH: TSH: 6.85 u[IU]/mL — AB (ref 0.450–4.500)

## 2017-12-27 LAB — VITAMIN B12: Vitamin B-12: 643 pg/mL (ref 232–1245)

## 2017-12-27 NOTE — Addendum Note (Signed)
Addended by: Pearletha Alfred on: 12/27/2017 01:23 PM   Modules accepted: Orders

## 2017-12-27 NOTE — Addendum Note (Signed)
Addended by: Pearletha Alfred on: 12/27/2017 12:57 PM   Modules accepted: Orders

## 2017-12-28 LAB — SPECIMEN STATUS REPORT

## 2017-12-28 LAB — T4, FREE: Free T4: 1 ng/dL (ref 0.82–1.77)

## 2017-12-30 NOTE — Progress Notes (Signed)
Internal Medicine Clinic Attending  Case discussed with Dr. Huang at the time of the visit.  We reviewed the resident's history and exam and pertinent patient test results.  I agree with the assessment, diagnosis, and plan of care documented in the resident's note. 

## 2018-01-01 ENCOUNTER — Telehealth: Payer: Self-pay | Admitting: Internal Medicine

## 2018-01-01 DIAGNOSIS — E038 Other specified hypothyroidism: Secondary | ICD-10-CM | POA: Insufficient documentation

## 2018-01-01 DIAGNOSIS — E039 Hypothyroidism, unspecified: Secondary | ICD-10-CM | POA: Insufficient documentation

## 2018-01-01 MED ORDER — LEVOTHYROXINE SODIUM 25 MCG PO TABS: 25.0000 ug | ORAL_TABLET | Freq: Every day | ORAL | 0 refills | Status: AC

## 2018-01-01 NOTE — Telephone Encounter (Signed)
Called to let her know. Thanks

## 2018-01-01 NOTE — Telephone Encounter (Signed)
Pt would like a call back about her Test Results.

## 2018-01-01 NOTE — Addendum Note (Signed)
Addended by: Pearletha Alfred on: 01/01/2018 04:20 PM   Modules accepted: Orders

## 2018-01-01 NOTE — Assessment & Plan Note (Signed)
Assessment Due to patient's complaints of neuropathy and weight gain, will treat for subclinical hypothyroidism despite only mild elevation in TSH and normal free T4. If no improvement in symptoms once on correct dose of levothyroxine, can discontinue levothyroxine as this may not be the cause for her symptoms.  Plan - Start levothyroxine daily before breakfast - RTC in 4-6 weeks - Check TSH at next visit - Adjust dose of levothyroxine to achieve goal of low-normal TSH - If no improvement in neuropathy at appropriate dose, discontinue levothyroxine

## 2018-01-03 ENCOUNTER — Encounter: Payer: Self-pay | Admitting: Internal Medicine

## 2018-01-23 ENCOUNTER — Other Ambulatory Visit: Payer: Self-pay

## 2018-01-23 ENCOUNTER — Encounter: Payer: Self-pay | Admitting: Internal Medicine

## 2018-01-23 ENCOUNTER — Ambulatory Visit: Payer: Self-pay | Admitting: Internal Medicine

## 2018-01-23 VITALS — BP 147/71 | HR 96 | Temp 98.5°F | Ht 67.0 in | Wt 274.3 lb

## 2018-01-23 DIAGNOSIS — I10 Essential (primary) hypertension: Secondary | ICD-10-CM

## 2018-01-23 DIAGNOSIS — E038 Other specified hypothyroidism: Secondary | ICD-10-CM

## 2018-01-23 DIAGNOSIS — F330 Major depressive disorder, recurrent, mild: Secondary | ICD-10-CM

## 2018-01-23 DIAGNOSIS — E039 Hypothyroidism, unspecified: Secondary | ICD-10-CM

## 2018-01-23 DIAGNOSIS — G629 Polyneuropathy, unspecified: Secondary | ICD-10-CM

## 2018-01-23 HISTORY — DX: Essential (primary) hypertension: I10

## 2018-01-23 MED ORDER — NORTRIPTYLINE HCL 25 MG PO CAPS: 25.0000 mg | ORAL_CAPSULE | Freq: Two times a day (BID) | ORAL | 0 refills | Status: DC

## 2018-01-23 MED ORDER — LISINOPRIL 10 MG PO TABS: 10.0000 mg | ORAL_TABLET | Freq: Every day | ORAL | 0 refills | Status: DC

## 2018-01-23 MED ORDER — NORTRIPTYLINE HCL 25 MG PO CAPS: 25.0000 mg | ORAL_CAPSULE | Freq: Every day | ORAL | 0 refills | Status: DC

## 2018-01-23 NOTE — Progress Notes (Signed)
   CC: follow up of depression  HPI:  [name] is a 62 y.o. female with PMH of depression, chronic post-traumatic headache, and OSA on CPAP who presents for follow-up of depression.  Please see the assessment and plan below for the status of the patient's chronic medical problems.  Past Medical History:  Diagnosis Date  . 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 Depression 05/08/2016  . Hyperlipidemia 05/08/2016  . Hypertension 01/23/2018  . Intractable chronic post-traumatic headache 05/08/2016   October 29, 2014 fell off a chair onto concrete floor  . Obstructive sleep apnea 05/08/2016   Review of Systems:   GEN: Negative for fevers CV: Negative for chest pain  PULM: Negative for SOB EXT: Negative for LE swelling PSYCH: Denies SI/HI.  Physical Exam:  Vitals:   01/23/18 1505 01/23/18 1603  BP: (!) 171/98 (!) 147/71  Pulse: (!) 104 96  Temp: 98.5 F (36.9 C)   TempSrc: Oral   SpO2: 98%   Weight: 274 lb 4.8 oz (124.4 kg)   Height: 5\' 7"  (1.702 m)    GEN: Sitting comfortably in chair in NAD CV: Borderline tachycardia & RR, no m/r/g PULM: CTAB, no wheezes or rales EXT: No LE edema  Assessment & Plan:   See Encounters Tab for problem based charting.  Patient discussed with Dr. Sandre Kittyaines

## 2018-01-23 NOTE — Assessment & Plan Note (Addendum)
Assessment We are treating for subclinical hypothyroidism to evaluate for any improvement, but otherwise no clear etiology. She denies any improvement thus far. If still no improvement, consider further work-up for alternate etiologies.  The symptoms are mostly in her toes and heels and ranges from numbness to prickly pain. The numbness intermittently affects the rest of the foot, but does not extend further up her legs.  She reports adherence with gabapentin - she has never tried pregabalin. She reports that her neurologist in [place] typically prescribes her gabapentin, so would have some difficulty with switching to pregabalin, however this could be a consideration to see if this helps with her symptoms. She is using OTC Nurenza, which she states is helpful.  Plan - Adjustment in levothyroxine dose, as indicated in separate A&P - Continue gabapentin  ADDENDUM: TSH within normal range at 4.230. Given low suspicion that her neuropathy is related to subclinical hypothyroidism and no improvement with treatment and TSH now in normal range, will discontinue levothyroxine at this time. At next appointment, could consider switch to pregabalin and/or further work-up, including possibly ESR, ANA, or EMG/NCV. Called patient to advise her to stop levothyroxine. Patient expressed understanding.

## 2018-01-23 NOTE — Assessment & Plan Note (Signed)
Assessment Patient has only mild elevation in TSH and normal free T4, consistent with subclinical hypothyroidism. However, she does have complaints of neuropathy and weight gain. I suspect that these are unrelated to her mildly elevated TSH, however after discussion with patient, she has elected to attempt treatment with levothyroxine to evaluate for improvement in her symptoms. She thus started levothyroxine daily at last visit, with which she reports adherence. Goal is to reach low-normal TSH and re-evaluate symptoms - if still no improvement at that dose, will stop levothyroxine and can then further evaluate for other causes of her neuropathy.  Plan - Continue levothyroxine daily - Check TSH today

## 2018-01-23 NOTE — Patient Instructions (Addendum)
FOLLOW-UP INSTRUCTIONS When: 4-6 weeks For: follow up of blood pressure What to bring: medications   Ms. Hyman HopesWebb,  It was nice to meet you today.  We are starting a new medicine for your blood pressure. - Please take lisinopril 10mg  once a day - Please come back to clinic in about 4-6 weeks to follow up on your blood pressure.  We are checking some bloodwork today for your thyroid level.  If you have any questions or concerns, call our clinic at [phone] or after hours call [phone] and ask for the internal medicine resident on call.

## 2018-01-23 NOTE — Assessment & Plan Note (Addendum)
Assessment BP 171/98, repeat 147/71. Patient was previously on propranolol for her headaches, however is no longer taking this medication due to it not being covered by her workman's comp.  Plan - Start lisinopril 10mg  daily - Return to clinic in 4-6 weeks - BMP at next visit  ADDENDUM: Received message from patient that coverage for propranolol was finally approved and she asked about restarting the propranolol given that she had just been started on lisinopril. Spoke to patient over phone and she reports that she is tolerating lisinopril without issue. She states that now that she has been off of the propranolol, she hasn't really noticed any difference in her post-traumatic headaches. Thus, at this time, I have advised for her to stop propranolol and continue the lisinopril. Patient expresses agreement and [facility].

## 2018-01-23 NOTE — Assessment & Plan Note (Signed)
Assessment PHQ9 of 7 today. Denies SI/HI. Patient reports significant improvement on nortriptyline. She states she also tried to see a [facility]or at [facility] but was unable to afford the initial visit at $160. 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  I am hesitant to continue increasing her dose of nortriptyline, as she is also on tramadol PRN and escitalopram, and with her history of head trauma, I would be concerned about increasing her risk of seizures. Patient amenable to continuing current dose. There [facility], but apparently she would still have to pay the price of a regular visit. I have spoken to our staff who will send her a list of mental health community resources that are available and patient to look into costs.  Plan - Continue nortriptyline 25mg  QHS and escitalopram 20mg  daily

## 2018-01-24 LAB — TSH: TSH: 4.23 u[IU]/mL (ref 0.450–4.500)

## 2018-01-30 ENCOUNTER — Encounter: Payer: Self-pay | Admitting: Internal Medicine

## 2018-02-01 NOTE — Progress Notes (Signed)
Internal Medicine Clinic Attending  Case discussed with Dr. Huang  at the time of the visit.  We reviewed the resident's history and exam and pertinent patient test results.  I agree with the assessment, diagnosis, and plan of care documented in the resident's note.  Alexander N Raines, MD   

## 2018-02-02 ENCOUNTER — Encounter: Payer: Self-pay | Admitting: *Deleted

## 2018-02-25 IMAGING — DX DG CERVICAL SPINE COMPLETE 4+V
5 series · 5 of 5 positions shown · non-contrast
Comparison: None.

CLINICAL DATA: Neck and left upper extremity pain for 5 days.

EXAM:
CERVICAL SPINE - COMPLETE 4+ VIEW

[w cervical spine lat]
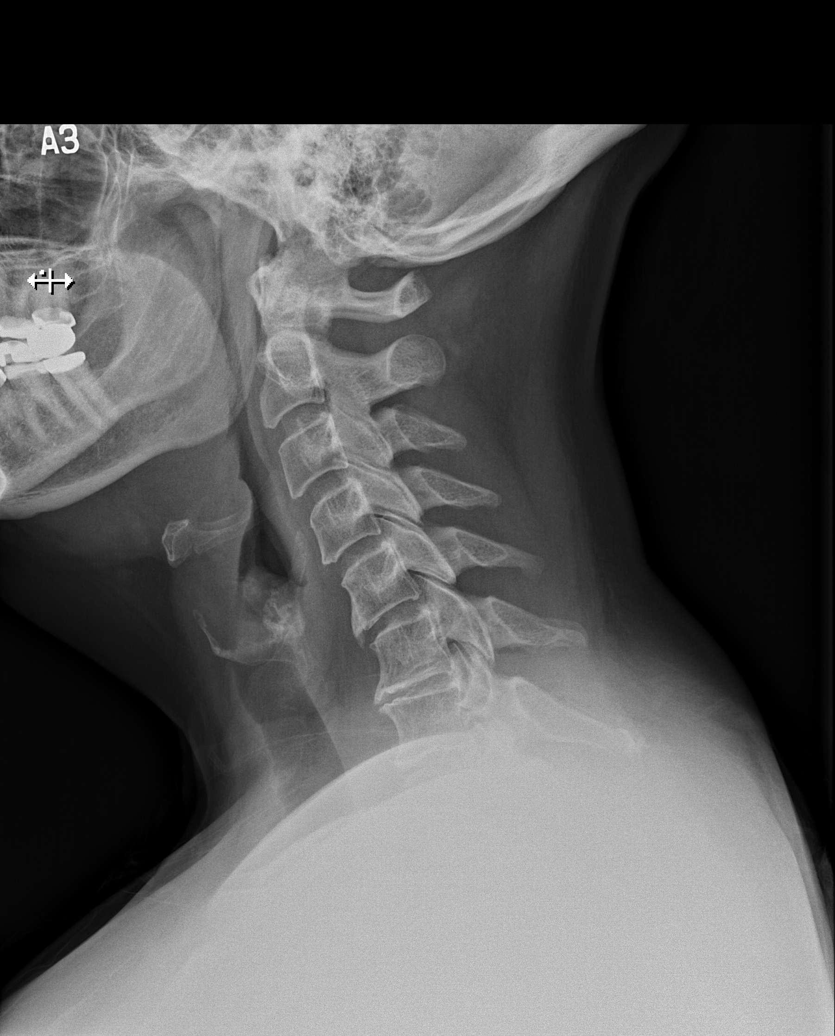

[w cervical spine ap_obl (1 of 2)]
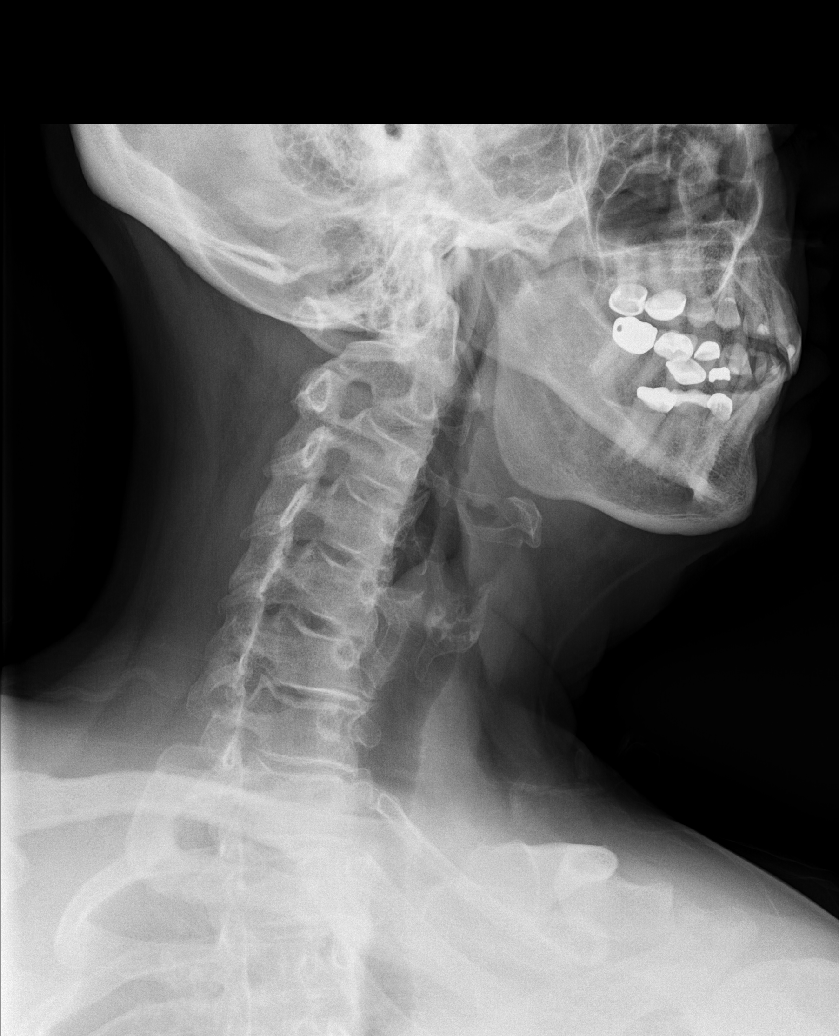

[w cervical spine ap_obl (2 of 2)]
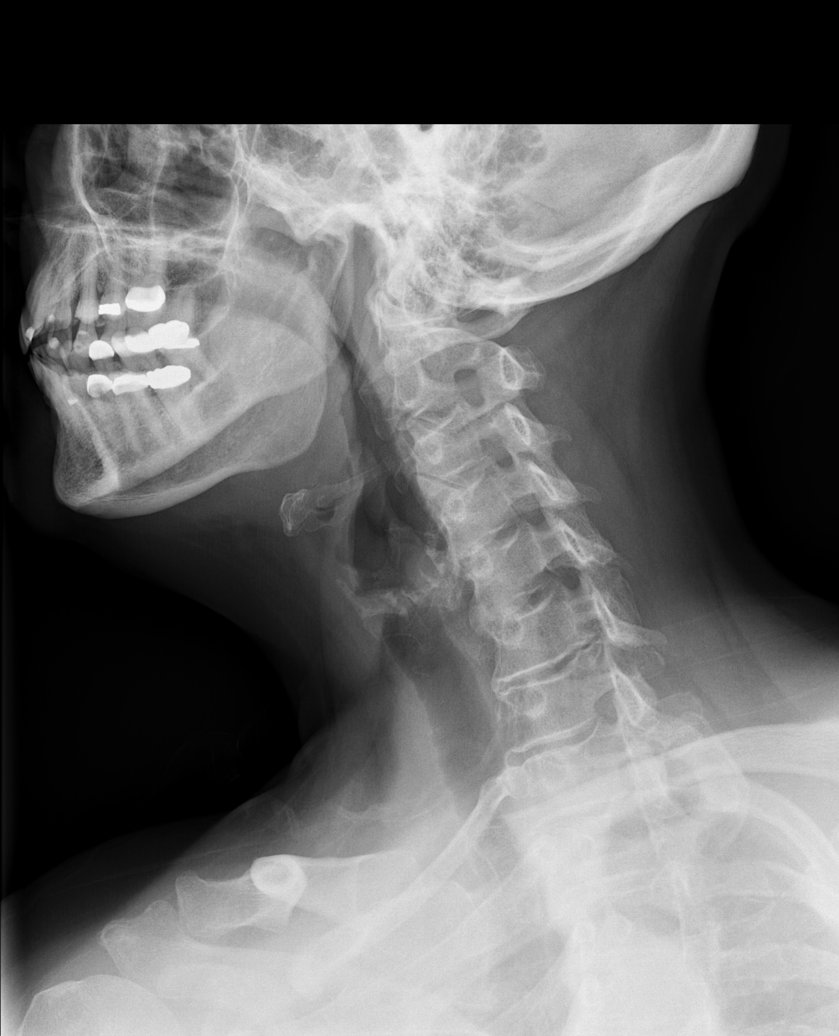

[w cervical spine ap]
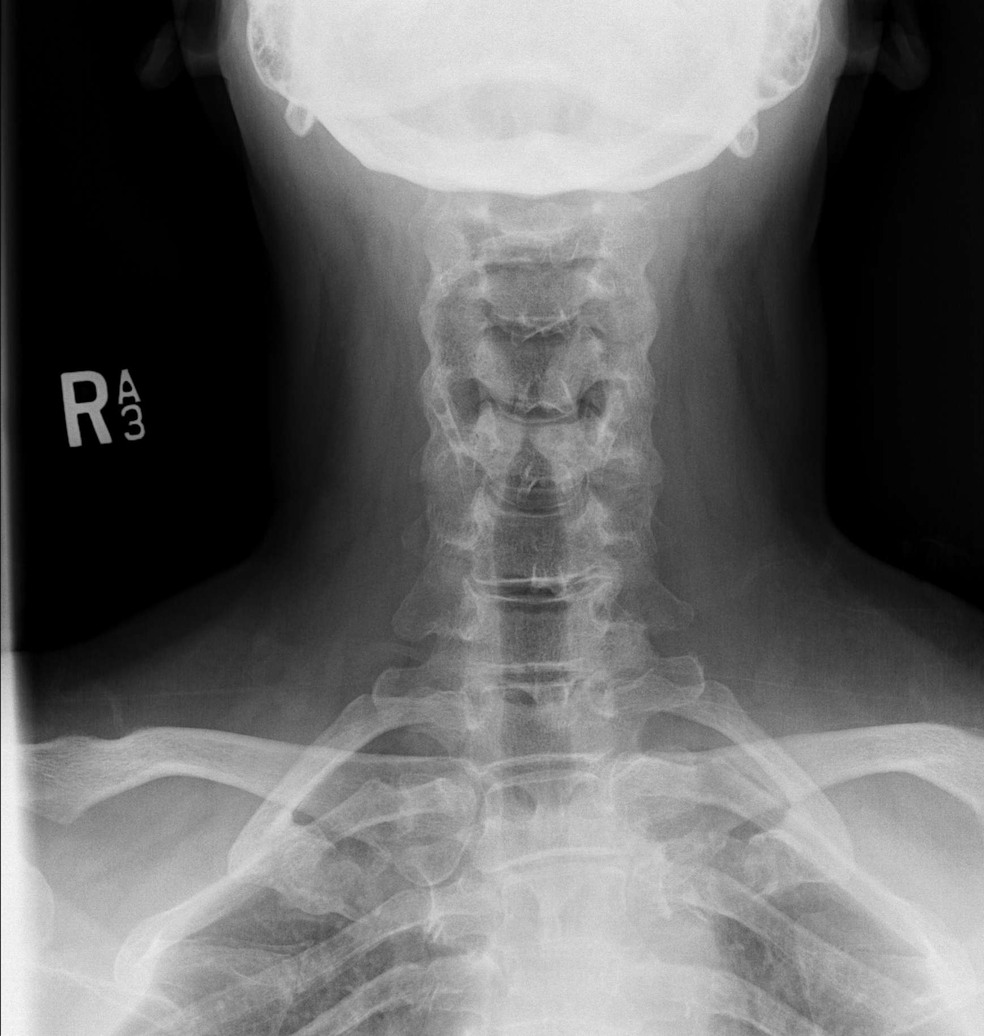

[w cervical spine odontoid]
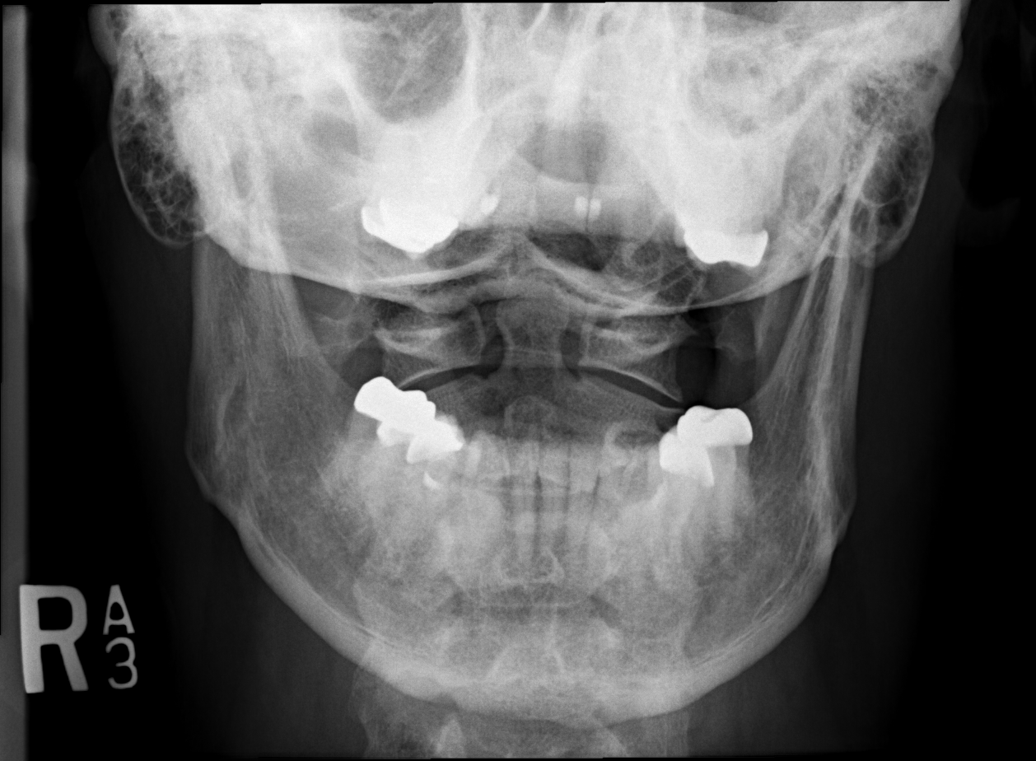

[5 of 5 positions shown; findings below may reference images not displayed]

FINDINGS: No fracture or spondylolisthesis is noted. Mild anterior osteophyte
formation is noted at C5-6. Moderate degenerative disc disease is
noted at C6-7 with anterior osteophyte formation. No prevertebral
soft tissue swelling is noted. Severe left-sided neural foraminal
stenosis is noted at C6-7 secondary to uncovertebral spurring.
IMPRESSION: Moderate degenerative disc disease is noted at C6-7, with severe
left-sided neural foraminal stenosis at this level secondary to
uncovertebral spurring. No fracture is noted.

## 2018-03-25 ENCOUNTER — Encounter: Payer: Self-pay | Admitting: Internal Medicine

## 2018-04-14 ENCOUNTER — Encounter: Payer: Self-pay | Admitting: Internal Medicine

## 2018-04-14 DIAGNOSIS — F330 Major depressive disorder, recurrent, mild: Secondary | ICD-10-CM

## 2018-04-15 ENCOUNTER — Other Ambulatory Visit: Payer: Self-pay | Admitting: Internal Medicine

## 2018-04-15 MED ORDER — ESCITALOPRAM OXALATE 20 MG PO TABS: 20.0000 mg | ORAL_TABLET | Freq: Every day | ORAL | 3 refills | Status: DC

## 2018-04-23 ENCOUNTER — Other Ambulatory Visit: Payer: Self-pay | Admitting: Internal Medicine

## 2018-04-23 MED ORDER — LISINOPRIL 10 MG PO TABS
10.0000 mg | ORAL_TABLET | Freq: Every day | ORAL | 0 refills | Status: DC
Start: 2018-04-23 — End: 2018-07-25

## 2018-05-15 ENCOUNTER — Telehealth: Payer: Self-pay | Admitting: Internal Medicine

## 2018-05-15 NOTE — Telephone Encounter (Signed)
The pt has a recall on her medicine, pls contact [phone]

## 2018-05-15 NOTE — Telephone Encounter (Signed)
Pt calls and states she needs something to replace her OTC RANITIDINE since it has been removed from store shelves. She is on a tight budget and cannot afford an office visit at this time. Please advise

## 2018-05-20 MED ORDER — FAMOTIDINE 20 MG PO TABS: 20.0000 mg | ORAL_TABLET | Freq: Every day | ORAL | 2 refills | Status: AC

## 2018-05-20 NOTE — Telephone Encounter (Signed)
Patient notified of Rx for famotadine and she is very Adult nurse. Kinnie Feil, RN, BSN

## 2018-05-20 NOTE — Telephone Encounter (Signed)
Ok. I dc ranitidine and prescribed famotidine 20mg  daily.

## 2018-07-25 ENCOUNTER — Other Ambulatory Visit: Payer: Self-pay | Admitting: *Deleted

## 2018-07-25 MED ORDER — LISINOPRIL 10 MG PO TABS: 10.0000 mg | ORAL_TABLET | Freq: Every day | ORAL | 0 refills | Status: DC

## 2018-08-14 ENCOUNTER — Other Ambulatory Visit: Payer: Self-pay

## 2018-08-14 ENCOUNTER — Ambulatory Visit (HOSPITAL_COMMUNITY)
Admission: RE | Admit: 2018-08-14 | Discharge: 2018-08-14 | Disposition: A | Discharge status: Home / Self Care | Payer: Self-pay | Source: Ambulatory Visit | Attending: Internal Medicine | Admitting: Internal Medicine

## 2018-08-14 ENCOUNTER — Encounter: Payer: Self-pay | Admitting: Internal Medicine

## 2018-08-14 ENCOUNTER — Ambulatory Visit: Payer: Self-pay | Admitting: Internal Medicine

## 2018-08-14 VITALS — BP 149/88 | HR 88 | Temp 99.1°F | Wt 265.8 lb

## 2018-08-14 DIAGNOSIS — G4733 Obstructive sleep apnea (adult) (pediatric): Secondary | ICD-10-CM

## 2018-08-14 DIAGNOSIS — G44321 Chronic post-traumatic headache, intractable: Secondary | ICD-10-CM

## 2018-08-14 DIAGNOSIS — Z90710 Acquired absence of both cervix and uterus: Secondary | ICD-10-CM

## 2018-08-14 DIAGNOSIS — M533 Sacrococcygeal disorders, not elsewhere classified: Secondary | ICD-10-CM

## 2018-08-14 DIAGNOSIS — F329 Major depressive disorder, single episode, unspecified: Secondary | ICD-10-CM

## 2018-08-14 NOTE — Assessment & Plan Note (Addendum)
Patient with 19mo h/o pinpoint sacral back pain w/o associated radiation or known trauma. Exam notable only for the tenderness to palpation, otherwise normal.   Due to nature of the pain, this seems more likely to be bone pain or MKS pain rather than nerve compression, however it is an unusual site no matter the cause. Her age place her at risk for MM - last Cmet in May 2019 unremarkable; UA at same time w/o proteinuria; these were prior to onset of pain.  In discussion of possible etiologies and patient's self-pay status, patient agreed to start workup with Xray and conservative treatment. Then if no improvement and negative xray findings, pursuing further labs/imaging (Cmet, ua).  She is up to date on breast cancer screening; had hysterectomy (has bil ovaries in place per patient) for fibroids and w/o concerning symptoms to warrant pap smear. She is due for repeat colonoscopy screening.  Plan: --Sacral/coccyx Xray - negative --prn tylenol/ibuprofen  --follow up in 2-4 wks if no improvement

## 2018-08-14 NOTE — Patient Instructions (Signed)
We'll start with an Xray of your sacrum. Use Tylenol/ibuprofen to help with the pain as well as the pillows to help relieve pressure. Continue doing a great job of stretching and keeping active!

## 2018-08-14 NOTE — Progress Notes (Signed)
   CC: sacral pain  HPI:  [name] is a 63 y.o. with a PMH of MDD, post-traumatic headaches, OSA presenting to clinic for evaluation of sacral pain.  Patient endorses ~466mo h/o of progressive sacral pain exacerbated by pressure on the affected region and changes in position. She denies radiation of the pain, LE weakness/numbness/tingling. She has checked and doesn't think there are skin or soft-tissue changes overlying the affected area. She has had some relief with anti-inflammatories when taking them for her headaches; otherwise no improvement with heat/ice, donut cushion. She continues to perform stretching exercises regularly without issue. She denies fevers, chills, weight loss, vaginal bleeding, hematuria, hematochezia, melena, bowel/bladder incontinence.  Please see problem based Assessment and Plan for status of patients chronic conditions.  Past Medical History:  Diagnosis Date  . 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 Depression 05/08/2016  . Hyperlipidemia 05/08/2016  . Hypertension 01/23/2018  . Intractable chronic post-traumatic headache 05/08/2016   October 29, 2014 fell off a chair onto concrete floor  . Obstructive sleep apnea 05/08/2016    Review of Systems:   Per HPI  Physical Exam:  Vitals:   08/14/18 1334  BP: (!) 149/88  Pulse: 88  Temp: 99.1 F (37.3 C)  TempSrc: Oral  SpO2: 96%  Weight: 265 lb 12.8 oz (120.6 kg)   GENERAL- alert, co-operative, appears as stated age, not in any distress. NEURO- Sensation intact in bil LEs. MSK - Pinpoint tenderness to palpation of mid sacrum ~1cm right of midline without associated overlying skin/soft tissue abnormalities. FROM and strength intact in bil LEs. SKIN- Warm, dry, no rash or lesion.  Assessment & Plan:   See Encounters Tab for problem based charting.   Patient discussed with Dr. Cherene AltesNarendra   Jumana Paccione, MD Internal Medicine PGY-3

## 2018-08-15 NOTE — Progress Notes (Signed)
Internal Medicine Clinic Attending  Case discussed with Dr. Svalina  at the time of the visit.  We reviewed the resident's history and exam and pertinent patient test results.  I agree with the assessment, diagnosis, and plan of care documented in the resident's note.  

## 2018-08-26 ENCOUNTER — Telehealth: Payer: Self-pay | Admitting: Pulmonary Disease

## 2018-08-26 NOTE — Telephone Encounter (Signed)
Called and spoke with patient, she stated that she is needing CPAP supplies. She is needing a prescription for [email or web address]   VS please advise, thank you.

## 2018-08-28 NOTE — Telephone Encounter (Signed)
She hasn't been seen since 2017.  She would need an ROV first before we are able to send script.  Appointment can be with me or NP.

## 2018-08-29 NOTE — Telephone Encounter (Signed)
Called pt and advised message from the provider. Pt understood and verbalized understanding. Nothing further is needed.   Pt made an appt with VS on Monday 09/01/2018

## 2018-09-08 ENCOUNTER — Ambulatory Visit: Payer: Self-pay | Admitting: Pulmonary Disease

## 2018-09-15 ENCOUNTER — Ambulatory Visit (INDEPENDENT_AMBULATORY_CARE_PROVIDER_SITE_OTHER): Payer: Self-pay | Admitting: Pulmonary Disease

## 2018-09-15 ENCOUNTER — Encounter: Payer: Self-pay | Admitting: Pulmonary Disease

## 2018-09-15 VITALS — BP 114/72 | HR 88 | Ht 67.0 in | Wt 260.0 lb

## 2018-09-15 DIAGNOSIS — G4733 Obstructive sleep apnea (adult) (pediatric): Secondary | ICD-10-CM

## 2018-09-15 NOTE — Progress Notes (Signed)
[facility]  Chief Complaint  Patient presents with  . Follow-up    pt doing well overall with cpap machine.    Constitutional:  BP 114/72 (BP Location: Left Arm, Cuff Size: Normal)   Pulse 88   Ht 5\' 7"  (1.702 m)   Wt 260 lb (117.9 kg)   SpO2 98%   BMI 40.72 kg/m   Past Medical History:  Headaches, HTN, HLD, Depression, Hypothyroidism, GERD  Brief Summary:  Heidi Mcguire is a 63 y.o. female with obstructive sleep apnea.  She was last seen in 2017.  She is doing well with CPAP.  No longer has insurance since she went on disability.  Has been buying her supplies from [email or web address]  Was told she needed a new script.  No issues with mask fit.  She has pain in her tail bone area.  Had sacrum/coccyx xray that was unrevealing.   Physical Exam:   Appearance - well kempt   ENMT - clear nasal mucosa, midline nasal  septum, no oral exudates, no LAN, trachea midline  Respiratory - normal chest wall, normal respiratory effort, no accessory muscle use, no wheeze/rales  CV - s1s2 regular rate and rhythm, no murmurs, no peripheral edema, radial pulses symmetric  GI - soft, non tender, no masses  Lymph - no adenopathy noted in neck and axillary areas  MSK - normal gait  Ext - no cyanosis, clubbing, or joint inflammation noted  Skin - no rashes, lesions, or ulcers  Neuro - normal strength, oriented x 3  Psych - normal mood and affect   Assessment/Plan:   Obstructive sleep apnea. - she is compliant with therapy - continue CPAP 9 cm H2O - she will get supplies from [email or web address] until she gets insurance; script completed  Sacral discomfort. - f/u with PCP   Patient Instructions  Will arrange for replacement mask from [email or web address]  Follow up in 1 year    Coralyn Helling, MD [facility] Pulmonary/Critical Care Pager: [phone] 09/15/2018, 3:27 PM  Flow Sheet    Sleep tests:  PSG 09/01/13 >> AHI 54 CPAP 08/15/18 to 09/13/18 >> used on  30 of 30 nights with average 9 hrs 46 min.  Average AHI 1.5 with CPAP 9 cm H2O  Medications:   Allergies as of 09/15/2018      Reactions   Bupropion    Headaches   Esomeprazole Magnesium Diarrhea   Gemfibrozil Hives   Lansoprazole Diarrhea   diarrhea   Omeprazole Magnesium Diarrhea   Simvastatin Hives   hives      Medication List       Accurate as of September 15, 2018  3:27 PM. Always use your most recent med list.        ADULT NUTRITIONAL SUPPLEMENT PO Take 1 tablet by mouth 2 (two) times daily.   atorvastatin 20 MG tablet Commonly known as:  LIPITOR Take 20 mg by mouth daily.   CALCIUM 500 + D 500-125 MG-UNIT Tabs Generic drug:  Calcium Carbonate-Vitamin D Take 2 tablets by mouth daily.   conjugated estrogens vaginal cream Commonly known as:  PREMARIN Place 1 Applicatorful vaginally daily.   Erenumab-aooe 70 MG/ML Soaj Inject 70 mg into the skin every 30 (thirty) days.   escitalopram 20 MG tablet Commonly known as:  LEXAPRO Take 1 tablet (20 mg total) by mouth daily.   famotidine 20 MG tablet Commonly known as:  PEPCID Take 1 tablet (20 mg total) by mouth daily.   gabapentin 300 MG capsule  Commonly known as:  NEURONTIN Take 2 capsules (600 mg total) by mouth 3 (three) times daily.   ibuprofen 400 MG tablet Commonly known as:  ADVIL,MOTRIN Take 400 mg by mouth every 8 (eight) hours as needed.   levothyroxine 25 MCG tablet Commonly known as:  SYNTHROID, LEVOTHROID Take 1 tablet (25 mcg total) by mouth daily before breakfast.   lisinopril 10 MG tablet Commonly known as:  PRINIVIL,ZESTRIL Take 1 tablet (10 mg total) by mouth daily.   multivitamin capsule Take 1 capsule by mouth daily.   nortriptyline 25 MG capsule Commonly known as:  PAMELOR Take 1 capsule (25 mg total) by mouth at bedtime.   OVER THE COUNTER MEDICATION Take 1 capsule by mouth 3 (three) times daily. Migravent   OVER THE COUNTER MEDICATION Take 2 capsules by mouth 2 (two) times  daily. Nurenza. For foot pain   simethicone 80 MG chewable tablet Commonly known as:  MYLICON Chew 1 tablet (80 mg total) by mouth every 6 (six) hours as needed for flatulence.   traMADol 50 MG tablet Commonly known as:  ULTRAM Take 1 tablet by mouth every 8 (eight) hours as needed.       Past Surgical History:  She  has a past surgical history that includes Abdominal hysterectomy (1992) and Knee arthroscopy (2012).  Family History:  Her family history includes Alzheimer's disease in her father; Bipolar disorder in her sister; Depression in her mother and sister.  Social History:  She  reports that she has never smoked. She has never used smokeless tobacco. She reports current alcohol use. She reports that she does not use drugs.

## 2018-09-15 NOTE — Patient Instructions (Signed)
Will arrange for replacement mask from [email or web address]  Follow up in 1 year

## 2018-10-07 ENCOUNTER — Other Ambulatory Visit: Payer: Self-pay

## 2018-10-07 DIAGNOSIS — F330 Major depressive disorder, recurrent, mild: Secondary | ICD-10-CM

## 2018-10-07 NOTE — Telephone Encounter (Signed)
nortriptyline (PAMELOR) 25 MG capsule, refill request @  [id number] - [place], [place] - [address] [phone] (Phone) [phone] (Fax)

## 2018-10-09 MED ORDER — NORTRIPTYLINE HCL 25 MG PO CAPS: 25.0000 mg | ORAL_CAPSULE | Freq: Every day | ORAL | 0 refills | Status: DC

## 2018-10-27 ENCOUNTER — Other Ambulatory Visit: Payer: Self-pay | Admitting: Internal Medicine

## 2018-11-03 ENCOUNTER — Other Ambulatory Visit: Payer: Self-pay

## 2018-11-03 ENCOUNTER — Encounter: Payer: Self-pay | Admitting: Internal Medicine

## 2018-11-03 ENCOUNTER — Ambulatory Visit: Payer: Self-pay | Admitting: Internal Medicine

## 2018-11-03 VITALS — BP 147/86 | HR 90 | Temp 98.1°F | Ht 67.0 in | Wt 264.6 lb

## 2018-11-03 DIAGNOSIS — R634 Abnormal weight loss: Secondary | ICD-10-CM

## 2018-11-03 DIAGNOSIS — I10 Essential (primary) hypertension: Secondary | ICD-10-CM

## 2018-11-03 DIAGNOSIS — M533 Sacrococcygeal disorders, not elsewhere classified: Secondary | ICD-10-CM

## 2018-11-03 DIAGNOSIS — F329 Major depressive disorder, single episode, unspecified: Secondary | ICD-10-CM

## 2018-11-03 DIAGNOSIS — E02 Subclinical iodine-deficiency hypothyroidism: Secondary | ICD-10-CM

## 2018-11-03 DIAGNOSIS — N6002 Solitary cyst of left breast: Secondary | ICD-10-CM | POA: Insufficient documentation

## 2018-11-03 DIAGNOSIS — Z6841 Body Mass Index (BMI) 40.0 and over, adult: Secondary | ICD-10-CM

## 2018-11-03 DIAGNOSIS — G4733 Obstructive sleep apnea (adult) (pediatric): Secondary | ICD-10-CM

## 2018-11-03 MED ORDER — AMLODIPINE BESYLATE 5 MG PO TABS: 5.0000 mg | ORAL_TABLET | Freq: Every day | ORAL | 0 refills | Status: AC

## 2018-11-03 NOTE — Assessment & Plan Note (Addendum)
Patient presents with worsening pain in her lower back/gluteal cleft.  The patient states that she has been having the pain for approximately 8 months now. Xray of sacrum and coccyx 08/14/18 showed grade 1 anterolisthesis l4 on l5 due to facet arthropathy. Loss of disc space height at L5-S1. No bony lesions.   The patient has not had any trauma to the area.  She is not an IV drug user or diabetic.  Assessment and plan  The patient's x-ray findings are suggestive of possible osteoarthritic changes contributing to patient's lower back pain.  The patient was recommended to continue using ibuprofen and acetaminophen for pain relief.  We will also suggest physical therapy after the patient has obtained financial assistance. Also advised her to work on weight loss.   Will recheck CMP for calcium and renal function to assess for smoldering myeloma.  We will also check ESR to evaluate for possible inflammatory process versus osteomyelitis.  Need further imaging if her pain continues to persist.

## 2018-11-03 NOTE — Progress Notes (Signed)
   CC: Sacral back pain  HPI:  [name] is a 63 y.o. female with hypertension, osa, subcliical hypothyroidism, mdd who presents for follow up of sacral back pain. Please see problem based charting for evaluation, assessment, and plan.  Past Medical History:  Diagnosis Date  . 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 Depression 05/08/2016  . Hyperlipidemia 05/08/2016  . Hypertension 01/23/2018  . Intractable chronic post-traumatic headache 05/08/2016   October 29, 2014 fell off a chair onto concrete floor  . Obstructive sleep apnea 05/08/2016   Review of Systems:    Review of Systems  Constitutional: Positive for weight loss. Negative for chills and fever.  Respiratory: Negative for cough and shortness of breath.   Gastrointestinal: Negative for abdominal pain, nausea and vomiting.  Musculoskeletal: Positive for back pain. Negative for falls.  Neurological: Negative for dizziness and headaches.  Psychiatric/Behavioral: Negative for depression.   Physical Exam:  Vitals:   11/03/18 1322 11/03/18 1348 11/03/18 1356  BP: (!) 177/101 (!) 166/86 (!) 147/86  Pulse: (!) 118 (!) 11 90  Temp: 98.1 F (36.7 C)    TempSrc: Oral    SpO2: 97%    Weight: 264 lb 9.6 oz (120 kg)    Height: 5\' 7"  (1.702 m)     Physical Exam  Constitutional: She appears well-developed and well-nourished. No distress.  HENT:  Head: Normocephalic and atraumatic.  Eyes: Conjunctivae are normal.  Cardiovascular: Normal rate, regular rhythm and normal heart sounds.  Respiratory: Effort normal and breath sounds normal. No respiratory distress. She has no wheezes.  GI: Soft. Bowel sounds are normal. She exhibits no distension. There is no abdominal tenderness.  Musculoskeletal:        General: Tenderness (lower back to palpation.) present. No edema.     Comments: 5/5 bilateral lower extremity strength. Intact bilateral sensation.   Neurological: She is alert.  Negative straight leg  raise  Skin: Rash noted. She is not diaphoretic. There is erythema (small 0.5cm raised erythematous lesions around bilateral nipples).  Psychiatric: She has a normal mood and affect. Her behavior is normal. Judgment and thought content normal.   Assessment & Plan:   See Encounters Tab for problem based charting.  Patient discussed with Dr. Criselda Peaches

## 2018-11-03 NOTE — Patient Instructions (Addendum)
It was a pleasure to see you today [name]. Please make the following changes:  I am sorry to hear that you are not doing well. I have ordered some labs to evaluate the cause of your back pain. I will call you about the results. Please work with our Artist so we can get you more support for the back pain. Please continue to use ibuprofen and acetaminophen for the pain currently.   For the lesion on your left breast please use warm compresses over the area and you may use bacitracin.   For your high blood pressure- please start taking amlodipine 5mg  daily along with lisinopril. Follow up in 4 weeks  If you have any questions or concerns, please call our clinic at [phone] between 9am-5pm and after hours call [phone] and ask for the internal medicine resident on call. If you feel you are having a medical emergency please call 911.   Thank you, we look forward to help you remain healthy!  Lorenso Courier, MD Internal Medicine PGY2

## 2018-11-03 NOTE — Assessment & Plan Note (Signed)
The patient states that she has noticed a 0.5 cm in size erythematous, raised, pruritic bump on left Areola.  The patient has numerous other bumps also similar to such bump on areola  Assessment and plan The patient's pump appears to be consistent with [place] tubercles.  Advised the patient to do warm compresses and use bacitracin over the bump.

## 2018-11-04 LAB — CMP14 + ANION GAP
ALBUMIN: 4.8 g/dL (ref 3.8–4.8)
ALK PHOS: 91 IU/L (ref 39–117)
ALT: 28 IU/L (ref 0–32)
AST: 27 IU/L (ref 0–40)
Albumin/Globulin Ratio: 2.4 — ABNORMAL HIGH (ref 1.2–2.2)
Anion Gap: 18 mmol/L (ref 10.0–18.0)
BUN/Creatinine Ratio: 17 (ref 12–28)
BUN: 13 mg/dL (ref 8–27)
Bilirubin Total: 0.2 mg/dL (ref 0.0–1.2)
CHLORIDE: 101 mmol/L (ref 96–106)
CO2: 20 mmol/L (ref 20–29)
CREATININE: 0.75 mg/dL (ref 0.57–1.00)
Calcium: 9.9 mg/dL (ref 8.7–10.3)
GFR calc Af Amer: 99 mL/min/{1.73_m2} (ref >59)
GFR calc non Af Amer: 86 mL/min/{1.73_m2} (ref >59)
GLUCOSE: 104 mg/dL — AB (ref 65–99)
Globulin, Total: 2 g/dL (ref 1.5–4.5)
Potassium: 4.4 mmol/L (ref 3.5–5.2)
Sodium: 139 mmol/L (ref 134–144)
Total Protein: 6.8 g/dL (ref 6.0–8.5)

## 2018-11-04 LAB — SEDIMENTATION RATE: SED RATE: 19 mm/h (ref 0–40)

## 2018-11-12 ENCOUNTER — Telehealth: Payer: Self-pay | Admitting: Internal Medicine

## 2018-11-12 NOTE — Telephone Encounter (Signed)
I tried to reach Heidi Mcguire after I got a message about patient called and said, she thinks she needs an MRI.   No answer x 2. I left a voice message and will try to reach her again.

## 2018-11-12 NOTE — Telephone Encounter (Signed)
Dr Maryla Morrow can you please contact this patient, pt is stating that she needs to have a MRI.  Since COVID-19 I am not seeing patients for financial assistance unless it's a emergency case. [phone]  Thanks   Lamonte Richer

## 2018-11-13 ENCOUNTER — Telehealth: Payer: Self-pay | Admitting: Internal Medicine

## 2018-11-13 NOTE — Telephone Encounter (Signed)
I called Heidi Mcguire and talked to her about her concern about getting MRI now.  She mentions that this chronic pain is at end of her spine/tail bone and is positional. (occurs in sitting position). She feels better when she walks and the pain has been better since she uses the cushion. Denies any numbness, tingling on the area or on his legs or feet and no weakness. No Hx of trauma, and no evidence of Fx on previous Xray. She has not been able to get financial consult session (due to COVID-19 situation) for potential MRI.  All of her questions addressed.  We discussed that no urgent MRI needed.  and she agrees to wait until visit. 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 And she will contact clinic of worsening. She mentions that she feels great to know that and verbalized understanding about this is not complete visit since no examination performed.  Heidi Mcguire, PGY-1

## 2018-11-29 NOTE — Progress Notes (Signed)
Internal Medicine Clinic Attending  Case discussed with Dr. Chundi at the time of the visit.  We reviewed the resident's history and exam and pertinent patient test results.  I agree with the assessment, diagnosis, and plan of care documented in the resident's note. 

## 2018-12-18 ENCOUNTER — Telehealth: Payer: Self-pay | Admitting: Internal Medicine

## 2018-12-18 ENCOUNTER — Ambulatory Visit (INDEPENDENT_AMBULATORY_CARE_PROVIDER_SITE_OTHER): Payer: Self-pay | Admitting: Internal Medicine

## 2018-12-18 ENCOUNTER — Other Ambulatory Visit: Payer: Self-pay

## 2018-12-18 DIAGNOSIS — I10 Essential (primary) hypertension: Secondary | ICD-10-CM

## 2018-12-18 DIAGNOSIS — G44329 Chronic post-traumatic headache, not intractable: Secondary | ICD-10-CM

## 2018-12-18 DIAGNOSIS — Z79899 Other long term (current) drug therapy: Secondary | ICD-10-CM

## 2018-12-18 DIAGNOSIS — M533 Sacrococcygeal disorders, not elsewhere classified: Secondary | ICD-10-CM

## 2018-12-18 NOTE — Telephone Encounter (Signed)
Pt is only going to be home 1-2:30 can you please call her around 1pm please [phone]

## 2018-12-18 NOTE — Assessment & Plan Note (Signed)
(  Televisit). Heidi Mcguire had been started on Amlodipine but has stopped that because she developed palpitation with it. She mentions that her palpitation went away after she stopped this new medicine. has checked her BP at home regularly and mentions that it has been at 140s/80s. She denies any new symptoms. (Has some chronic headache after previous head trauma but it has been the same). I recommended increasing Lisinopril dose. But she is not sure if she can come for follow up and blood work as she is moving from [place] to [place]. She prefers to continue current dose and then follow up with a primary care in [place]. Since her blood pressure is not severely elevated, I agree that she can continue with current dose but follow up soon for better control.  -Continue Lisinopril 10 mg QD -F/u with [place] or new PCP when possible

## 2018-12-18 NOTE — Progress Notes (Signed)
  [facility] Internal Medicine Residency Telephone Encounter Continuity Care Appointment  HPI:   This telephone encounter was created for Ms. Dorita Sciara on 12/18/2018 for the following purpose/cc HTN follow up.   Past Medical History:  Past Medical History:  Diagnosis Date  . 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 Depression 05/08/2016  . Hyperlipidemia 05/08/2016  . Hypertension 01/23/2018  . Intractable chronic post-traumatic headache 05/08/2016   October 29, 2014 fell off a chair onto concrete floor  . Obstructive sleep apnea 05/08/2016      ROS:      Assessment / Plan / Recommendations:   Please see A&P under problem oriented charting for assessment of the patient's acute and chronic medical conditions.   As always, pt is advised that if symptoms worsen or new symptoms arise, they should go to an urgent care facility or to to ER for further evaluation.   Consent and Medical Decision Making:   Patient discussed with Dr. Heide Spark  This is a telephone encounter between Marzee Keady and [facility] on 12/18/2018 for routine follow up. The visit was conducted with the patient located at home and [facility]. The patient's identity was confirmed using their DOB and current address. The patient has consented to being evaluated through a telephone encounter and understands the associated risks (an examination cannot be done and the patient may need to come in for an appointment) / benefits (allows the patient to remain at home, decreasing exposure to coronavirus). I personally spent 15 minutes on medical discussion.

## 2018-12-18 NOTE — Assessment & Plan Note (Signed)
She reports continuous pain but since she is moving to [place], she would likes to take care of that there. I offered her to contact us if she changed her mind of if her symptoms gets worse.

## 2018-12-19 NOTE — Progress Notes (Signed)
Internal Medicine Clinic Attending  Case discussed with Dr. Masoudi  at the time of the visit.  We reviewed the resident's history and exam and pertinent patient test results.  I agree with the assessment, diagnosis, and plan of care documented in the resident's note.  

## 2018-12-30 ENCOUNTER — Other Ambulatory Visit: Payer: Self-pay | Admitting: *Deleted

## 2018-12-30 DIAGNOSIS — F330 Major depressive disorder, recurrent, mild: Secondary | ICD-10-CM

## 2018-12-30 MED ORDER — NORTRIPTYLINE HCL 25 MG PO CAPS: 25.0000 mg | ORAL_CAPSULE | Freq: Every day | ORAL | 0 refills | Status: DC

## 2019-01-30 ENCOUNTER — Other Ambulatory Visit: Payer: Self-pay | Admitting: Internal Medicine

## 2019-01-30 DIAGNOSIS — I10 Essential (primary) hypertension: Secondary | ICD-10-CM

## 2019-01-30 DIAGNOSIS — F330 Major depressive disorder, recurrent, mild: Secondary | ICD-10-CM

## 2019-01-30 NOTE — Telephone Encounter (Signed)
Returned call to patient. She has moved to [place] and is in process of finding new PCP but may have difficulty 2/2 Covid-19. Pharmacy is requesting new refills for her. Hubbard Hartshorn, RN, BSN

## 2019-01-30 NOTE — Telephone Encounter (Signed)
Needs refill on escitalopram (LEXAPRO) 20 MG tablet   nortriptyline (PAMELOR) 25 MG capsule  lisinopril (PRINIVIL,ZESTRIL) 10 MG tablet   ;pt contact [place]; [place] Bobetta Lime MD

## 2019-01-31 MED ORDER — ESCITALOPRAM OXALATE 20 MG PO TABS: 20.0000 mg | ORAL_TABLET | Freq: Every day | ORAL | 3 refills | Status: AC

## 2019-01-31 MED ORDER — NORTRIPTYLINE HCL 25 MG PO CAPS: 25.0000 mg | ORAL_CAPSULE | Freq: Every day | ORAL | 0 refills | Status: AC

## 2019-01-31 MED ORDER — LISINOPRIL 10 MG PO TABS: 10.0000 mg | ORAL_TABLET | Freq: Every day | ORAL | 0 refills | Status: AC

## 2019-03-30 NOTE — Telephone Encounter (Signed)
Opened in error
